# Patient Record
Sex: Female | Born: 1960 | Race: White | Hispanic: No | Smoking: Never smoker
Health system: Southern US, Community
[De-identification: ages and names within clinical notes are randomized; demographics above are authoritative.]

---

## 1998-12-24 ENCOUNTER — Other Ambulatory Visit: Admission: RE | Admit: 1998-12-24 | Discharge: 1998-12-24 | Payer: Self-pay | Admitting: *Deleted

## 2000-01-04 ENCOUNTER — Other Ambulatory Visit: Admission: RE | Admit: 2000-01-04 | Discharge: 2000-01-04 | Payer: Self-pay | Admitting: *Deleted

## 2002-01-12 ENCOUNTER — Other Ambulatory Visit: Admission: RE | Admit: 2002-01-12 | Discharge: 2002-01-12 | Payer: Self-pay | Admitting: *Deleted

## 2005-07-01 ENCOUNTER — Ambulatory Visit: Payer: Self-pay | Admitting: Obstetrics and Gynecology

## 2007-06-15 ENCOUNTER — Ambulatory Visit: Payer: Self-pay | Admitting: Obstetrics and Gynecology

## 2008-07-31 ENCOUNTER — Ambulatory Visit: Payer: Self-pay | Admitting: Obstetrics and Gynecology

## 2009-08-27 ENCOUNTER — Ambulatory Visit: Payer: Self-pay | Admitting: Obstetrics and Gynecology

## 2010-09-08 ENCOUNTER — Ambulatory Visit: Payer: Self-pay | Admitting: Obstetrics and Gynecology

## 2011-09-14 ENCOUNTER — Ambulatory Visit: Payer: Self-pay | Admitting: Obstetrics and Gynecology

## 2012-06-05 ENCOUNTER — Ambulatory Visit: Payer: Self-pay | Admitting: Orthopedic Surgery

## 2012-06-05 LAB — HCG, QUANTITATIVE, PREGNANCY: Beta Hcg, Quant.: <1 m[IU]/mL — ABNORMAL LOW

## 2013-03-09 ENCOUNTER — Ambulatory Visit: Payer: Self-pay | Admitting: Gastroenterology

## 2013-03-09 LAB — HM COLONOSCOPY

## 2013-12-19 LAB — CBC AND DIFFERENTIAL
HCT: 40 % (ref 36–46)
Hemoglobin: 13.3 g/dL (ref 12.0–16.0)
Platelets: 333 10*3/uL (ref 150–399)
WBC: 5.5 10^3/mL

## 2013-12-19 LAB — HEPATIC FUNCTION PANEL
ALT: 33 U/L (ref 7–35)
AST: 26 U/L (ref 13–35)
Alkaline Phosphatase: 93 U/L (ref 25–125)

## 2013-12-19 LAB — BASIC METABOLIC PANEL
BUN: 21 mg/dL (ref 4–21)
CREATININE: 0.8 mg/dL (ref 0.5–1.1)
GLUCOSE: 94 mg/dL
Potassium: 4.4 mmol/L (ref 3.4–5.3)
SODIUM: 142 mmol/L (ref 137–147)

## 2013-12-19 LAB — LIPID PANEL
CHOLESTEROL: 254 mg/dL — AB (ref 0–200)
HDL: 68 mg/dL (ref 35–70)
LDL Cholesterol: 168 mg/dL
TRIGLYCERIDES: 88 mg/dL (ref 40–160)

## 2013-12-19 LAB — TSH: TSH: 1.51 u[IU]/mL (ref 0.41–5.90)

## 2014-07-20 ENCOUNTER — Other Ambulatory Visit: Payer: Self-pay | Admitting: Emergency Medicine

## 2014-07-20 ENCOUNTER — Encounter: Payer: Self-pay | Admitting: Emergency Medicine

## 2014-07-20 DIAGNOSIS — G43909 Migraine, unspecified, not intractable, without status migrainosus: Secondary | ICD-10-CM | POA: Insufficient documentation

## 2014-07-20 DIAGNOSIS — J309 Allergic rhinitis, unspecified: Secondary | ICD-10-CM | POA: Insufficient documentation

## 2014-07-20 DIAGNOSIS — F329 Major depressive disorder, single episode, unspecified: Secondary | ICD-10-CM | POA: Insufficient documentation

## 2014-07-20 DIAGNOSIS — G4700 Insomnia, unspecified: Secondary | ICD-10-CM | POA: Insufficient documentation

## 2014-07-20 DIAGNOSIS — F411 Generalized anxiety disorder: Secondary | ICD-10-CM | POA: Insufficient documentation

## 2014-07-20 DIAGNOSIS — N951 Menopausal and female climacteric states: Secondary | ICD-10-CM | POA: Insufficient documentation

## 2014-09-26 ENCOUNTER — Ambulatory Visit: Payer: Self-pay | Admitting: Family Medicine

## 2014-12-12 ENCOUNTER — Other Ambulatory Visit: Payer: Self-pay | Admitting: Family Medicine

## 2014-12-13 ENCOUNTER — Other Ambulatory Visit: Payer: Self-pay

## 2014-12-17 ENCOUNTER — Telehealth: Payer: Self-pay

## 2014-12-17 NOTE — Telephone Encounter (Signed)
[facility] has sent request for her Sumatriptan pills and injectable.  Last visit was 03/28/2014

## 2014-12-18 ENCOUNTER — Other Ambulatory Visit: Payer: Self-pay

## 2014-12-18 MED ORDER — SUMATRIPTAN SUCCINATE 100 MG PO TABS: 100.0000 mg | ORAL_TABLET | Freq: Once | ORAL | Status: DC

## 2014-12-18 MED ORDER — SUMATRIPTAN SUCCINATE REFILL 6 MG/0.5ML ~~LOC~~ SOCT: 6.0000 mg | Freq: Once | SUBCUTANEOUS | Status: DC

## 2014-12-18 NOTE — Telephone Encounter (Signed)
Okay to refill 1 year 

## 2015-02-06 ENCOUNTER — Encounter: Payer: Self-pay | Admitting: Physician Assistant

## 2015-02-06 ENCOUNTER — Ambulatory Visit (INDEPENDENT_AMBULATORY_CARE_PROVIDER_SITE_OTHER): Payer: Managed Care, Other (non HMO) | Admitting: Physician Assistant

## 2015-02-06 VITALS — BP 102/70 | HR 90 | Temp 98.7°F | Resp 18 | Wt 153.0 lb

## 2015-02-06 DIAGNOSIS — J0100 Acute maxillary sinusitis, unspecified: Secondary | ICD-10-CM

## 2015-02-06 DIAGNOSIS — R87619 Unspecified abnormal cytological findings in specimens from cervix uteri: Secondary | ICD-10-CM | POA: Insufficient documentation

## 2015-02-06 DIAGNOSIS — J40 Bronchitis, not specified as acute or chronic: Secondary | ICD-10-CM | POA: Diagnosis not present

## 2015-02-06 DIAGNOSIS — R05 Cough: Secondary | ICD-10-CM | POA: Diagnosis not present

## 2015-02-06 DIAGNOSIS — R059 Cough, unspecified: Secondary | ICD-10-CM

## 2015-02-06 MED ORDER — HYDROCODONE-HOMATROPINE 5-1.5 MG/5ML PO SYRP: 5.0000 mL | ORAL_SOLUTION | Freq: Four times a day (QID) | ORAL | Status: DC | PRN

## 2015-02-06 MED ORDER — BENZONATATE 200 MG PO CAPS: 200.0000 mg | ORAL_CAPSULE | Freq: Three times a day (TID) | ORAL | Status: DC | PRN

## 2015-02-06 MED ORDER — AMOXICILLIN-POT CLAVULANATE 875-125 MG PO TABS: 1.0000 | ORAL_TABLET | Freq: Two times a day (BID) | ORAL | Status: DC

## 2015-02-06 MED ORDER — PREDNISONE 10 MG (21) PO TBPK: ORAL_TABLET | ORAL | Status: DC

## 2015-02-06 NOTE — Progress Notes (Signed)
Patient ID: Tammy Edwards, female   DOB: 04/23/1960, 54 y.o.   MRN: [id number]       Patient: Tammy Edwards Female    DOB: 03-21-1960   54 y.o.   MRN: [id number] Visit Date: 02/06/2015  Today's Provider: Mar Daring, PA-C   Chief Complaint  Patient presents with  . Nasal Congestion  . Cough   Subjective:    HPI  Patient has had a cold for about 2 weeks and she was trying to treat it with Sudafed, Delsym and Mucinex but not getting better. She is going [place] the mountains this weekend and would like to feel better by then. Symptoms include: cough productive and keeps her up at night, runny nose, post nasal drainage, sinus pressure, headache, scratchy throat, shortness of breath and wheezing. No fever that she knows of.     Allergies  Allergen Reactions  . Codeine    Previous Medications   CHOLECALCIFEROL (VITAMIN D) 1000 UNITS TABLET    Take by mouth.   MULTIPLE VITAMIN PO    Take by mouth.   SUMATRIPTAN (IMITREX) 100 MG TABLET    Take 1 tablet (100 mg total) by mouth once.   SUMATRIPTAN SUCCINATE REFILL 6 MG/0.5ML SOCT    Inject 6 mg into the skin once.    Review of Systems  Constitutional: Positive for fatigue. Negative for fever and chills.  HENT: Positive for congestion, postnasal drip, rhinorrhea, sinus pressure, sore throat and voice change. Negative for ear pain.   Eyes: Negative.   Respiratory: Positive for cough, chest tightness, shortness of breath and wheezing.   Cardiovascular: Negative.  Negative for chest pain.  Gastrointestinal: Negative for nausea, vomiting and abdominal pain.  Neurological: Positive for headaches. Negative for dizziness.    Social History  Substance Use Topics  . Smoking status: Never Smoker   . Smokeless tobacco: Never Used  . Alcohol Use: No   Objective:   BP 102/70 mmHg  Pulse 90  Temp(Src) 98.7 F (37.1 C)  Resp 18  Wt 153 lb (69.4 kg)  SpO2 99%  Physical Exam  Constitutional: She appears well-developed and  well-nourished. No distress.  HENT:  Head: Normocephalic and atraumatic.  Right Ear: Hearing, external ear and ear canal normal. Tympanic membrane is not erythematous and not bulging. A middle ear effusion is present.  Left Ear: Hearing, external ear and ear canal normal. Tympanic membrane is not erythematous and not bulging. A middle ear effusion is present.  Nose: Mucosal edema and rhinorrhea present. Right sinus exhibits maxillary sinus tenderness and frontal sinus tenderness. Left sinus exhibits maxillary sinus tenderness and frontal sinus tenderness.  Mouth/Throat: Uvula is midline, oropharynx is clear and moist and mucous membranes are normal. No oropharyngeal exudate, posterior oropharyngeal edema or posterior oropharyngeal erythema.  Neck: Normal range of motion. Neck supple. No tracheal deviation present. No thyromegaly present.  Cardiovascular: Normal rate, regular rhythm and normal heart sounds.  Exam reveals no gallop and no friction rub.   No murmur heard. Pulmonary/Chest: Effort normal. No stridor. No respiratory distress. She has no decreased breath sounds. She has wheezes (exp throughout). She has no rhonchi. She has no rales.  Lymphadenopathy:    She has no cervical adenopathy.  Skin: She is not diaphoretic.  Vitals reviewed.       Assessment & Plan:     1. Acute maxillary sinusitis, recurrence not specified Worsening symptoms. I will treat with Augmentin as below. I did advise that she may continue to use Mucinex DM  to help with congestion. She also needs to make sure to get plenty of rest and to stay well-hydrated. She is to call the office if symptoms failed to improve or worsen. - amoxicillin-clavulanate (AUGMENTIN) 875-125 MG tablet; Take 1 tablet by mouth 2 (two) times daily.  Dispense: 20 tablet; Refill: 0  2. Bronchitis She did have expiratory wheezes on exam today. I will add a prednisone taper as below to the Augmentin that was prescribed as above. She is to call  the office if symptoms fail to improve or worsen. - predniSONE (STERAPRED UNI-PAK 21 TAB) 10 MG (21) TBPK tablet; Take as directed on package directions  Dispense: 21 tablet; Refill: 0  3. Cough Worsening. I will prescribe Hycodan cough syrup for her to use with nighttime cough. I will also give Tessalon Perles as below for her to use for daytime cough as it does not cause drowsiness. To call the office if symptoms fail to improve or worsen. - HYDROcodone-homatropine (HYCODAN) 5-1.5 MG/5ML syrup; Take 5 mLs by mouth every 6 (six) hours as needed for cough.  Dispense: 180 mL; Refill: 0 - benzonatate (TESSALON) 200 MG capsule; Take 1 capsule (200 mg total) by mouth 3 (three) times daily as needed for cough.  Dispense: 30 capsule; Refill: 0       Mar Daring, PA-C  [facility]

## 2015-03-10 ENCOUNTER — Encounter: Payer: Self-pay | Admitting: Family Medicine

## 2015-03-10 ENCOUNTER — Ambulatory Visit (INDEPENDENT_AMBULATORY_CARE_PROVIDER_SITE_OTHER): Payer: Managed Care, Other (non HMO) | Admitting: Family Medicine

## 2015-03-10 VITALS — BP 102/64 | HR 80 | Temp 98.0°F | Resp 16 | Wt 150.0 lb

## 2015-03-10 DIAGNOSIS — Z13220 Encounter for screening for lipoid disorders: Secondary | ICD-10-CM

## 2015-03-10 DIAGNOSIS — F411 Generalized anxiety disorder: Secondary | ICD-10-CM

## 2015-03-10 DIAGNOSIS — G43809 Other migraine, not intractable, without status migrainosus: Secondary | ICD-10-CM | POA: Diagnosis not present

## 2015-03-10 DIAGNOSIS — G4700 Insomnia, unspecified: Secondary | ICD-10-CM | POA: Diagnosis not present

## 2015-03-10 DIAGNOSIS — F339 Major depressive disorder, recurrent, unspecified: Secondary | ICD-10-CM | POA: Diagnosis not present

## 2015-03-10 DIAGNOSIS — E559 Vitamin D deficiency, unspecified: Secondary | ICD-10-CM | POA: Diagnosis not present

## 2015-03-10 MED ORDER — NAPROXEN 500 MG PO TABS: 500.0000 mg | ORAL_TABLET | Freq: Two times a day (BID) | ORAL | Status: DC

## 2015-03-10 MED ORDER — SUMATRIPTAN SUCCINATE REFILL 6 MG/0.5ML ~~LOC~~ SOCT: 6.0000 mg | Freq: Once | SUBCUTANEOUS | Status: DC

## 2015-03-10 MED ORDER — SUMATRIPTAN SUCCINATE 100 MG PO TABS: 100.0000 mg | ORAL_TABLET | Freq: Once | ORAL | Status: DC

## 2015-03-10 MED ORDER — AZITHROMYCIN 250 MG PO TABS: ORAL_TABLET | ORAL | Status: DC

## 2015-03-10 MED ORDER — PROMETHAZINE HCL 25 MG PO TABS: 25.0000 mg | ORAL_TABLET | Freq: Three times a day (TID) | ORAL | Status: DC | PRN

## 2015-03-10 NOTE — Progress Notes (Signed)
Patient ID: Tammy Edwards, female   DOB: 07-Jun-1960, 55 y.o.   MRN: [id number]    Subjective:  HPI Pt reports that she is here for a follow up of migraines and to get refills on the medications that she takes for them. She reports that she only has 3-4 headaches a month.  Pt also wanted to know if she can have something for nausea, Naproxen and a Zpak because she is about to go on a cruise for a week, she has never been on a cruise and is afraid that she will get sick on the boat. It is of note the cruise is a gift from her ex husband and she is excited about this. Overall she feels well.  Prior to Admission medications   Medication Sig Start Date End Date Taking? Authorizing Provider  cholecalciferol (VITAMIN D) 1000 UNITS tablet Take by mouth. Reported on 03/10/2015   Yes Historical Provider, MD  MULTIPLE VITAMIN PO Take by mouth.   Yes Historical Provider, MD  naproxen (NAPROSYN) 500 MG tablet  02/06/15  Yes Historical Provider, MD  SUMAtriptan (IMITREX) 100 MG tablet Take 1 tablet (100 mg total) by mouth once. 12/18/14  Yes Rodina Pinales Maceo Pro., MD  SUMAtriptan Succinate Refill 6 MG/0.5ML SOCT Inject 6 mg into the skin once. 12/18/14  Yes Khasir Woodrome Maceo Pro., MD    Patient Active Problem List   Diagnosis Date Noted  . Abnormal cervical Papanicolaou smear 02/06/2015  . Allergic rhinitis 07/20/2014  . Anxiety, generalized 07/20/2014  . Cannot sleep 07/20/2014  . Affective disorder, major ([place]) 07/20/2014  . Headache, migraine 07/20/2014  . Symptomatic menopausal or female climacteric states 07/20/2014    History reviewed. No pertinent past medical history.  Social History   Social History  . Marital Status: Divorced    Spouse Name: N/A  . Number of Children: N/A  . Years of Education: N/A   Occupational History  . Not on file.   Social History Main Topics  . Smoking status: Never Smoker   . Smokeless tobacco: Never Used  . Alcohol Use: No  . Drug Use: No  . Sexual  Activity: Not on file   Other Topics Concern  . Not on file   Social History Narrative    Allergies  Allergen Reactions  . Codeine     Review of Systems  Constitutional: Negative.   HENT: Negative.   Eyes: Negative.   Respiratory: Negative.   Cardiovascular: Negative.   Gastrointestinal: Negative.   Genitourinary: Negative.   Musculoskeletal: Negative.   Skin: Negative.   Neurological: Negative.   Endo/Heme/Allergies: Negative.   Psychiatric/Behavioral: Negative.     Immunization History  Administered Date(s) Administered  . Tdap 01/23/2014   Objective:  BP 102/64 mmHg  Pulse 80  Temp(Src) 98 F (36.7 C) (Oral)  Resp 16  Wt 150 lb (68.04 kg)  Physical Exam  Constitutional: She is oriented to person, place, and time and well-developed, well-nourished, and in no distress.  HENT:  Head: Normocephalic and atraumatic.  Right Ear: External ear normal.  Left Ear: External ear normal.  Nose: Nose normal.  Eyes: Conjunctivae are normal.  Neck: Neck supple.  Cardiovascular: Normal rate, regular rhythm, normal heart sounds and intact distal pulses.   Pulmonary/Chest: Effort normal and breath sounds normal.  Abdominal: Soft.  Neurological: She is alert and oriented to person, place, and time.  Skin: Skin is warm and dry.  Psychiatric: Mood, memory, affect and judgment normal.    Lab Results  Component Value Date   WBC 5.5 12/19/2013   HGB 13.3 12/19/2013   HCT 40 12/19/2013   PLT 333 12/19/2013   CHOL 254* 12/19/2013   TRIG 88 12/19/2013   HDL 68 12/19/2013   LDLCALC 168 12/19/2013   TSH 1.51 12/19/2013    CMP     Component Value Date/Time   NA 142 12/19/2013   K 4.4 12/19/2013   BUN 21 12/19/2013   CREATININE 0.8 12/19/2013   AST 26 12/19/2013   ALT 33 12/19/2013   ALKPHOS 93 12/19/2013    Assessment and Plan :  1. Other type of migraine  - promethazine (PHENERGAN) 25 MG tablet; Take 1 tablet (25 mg total) by mouth every 8 (eight) hours as  needed for nausea or vomiting.  Dispense: 30 tablet; Refill: 0 - naproxen (NAPROSYN) 500 MG tablet; Take 1 tablet (500 mg total) by mouth 2 (two) times daily with a meal.  Dispense: 60 tablet; Refill: 12 - SUMAtriptan Succinate Refill 6 MG/0.5ML SOCT; Inject 6 mg into the skin once.  Dispense: 3 cartridge; Refill: 12 - SUMAtriptan (IMITREX) 100 MG tablet; Take 1 tablet (100 mg total) by mouth once.  Dispense: 10 tablet; Refill: 12 - CBC with Differential/Platelet  2. Anxiety, generalized  - TSH  3. Cannot sleep/Chronic Insomnia   4. Recurrent major depressive disorder, remission status unspecified ([place]) In remission.More than 50% of time spent in counselling. - TSH  5. Lipid screening  - Comprehensive metabolic panel - Lipid Panel With LDL/HDL Ratio  6. Vitamin D deficiency  - VITAMIN D 25 Hydroxy (Vit-D Deficiency, Fractures) . Patient was seen and examined by Dr. Miguel Aschoff, and noted scribed by Webb Laws, [place] MD [facility] 03/10/2015 10:41 AM

## 2015-03-11 ENCOUNTER — Other Ambulatory Visit: Payer: Self-pay | Admitting: Family Medicine

## 2015-03-11 LAB — CBC WITH DIFFERENTIAL/PLATELET
Basophils Absolute: 0 10*3/uL (ref 0.0–0.2)
Basos: 1 %
EOS (ABSOLUTE): 0.1 10*3/uL (ref 0.0–0.4)
Eos: 1 %
Hematocrit: 38.6 % (ref 34.0–46.6)
Hemoglobin: 12.2 g/dL (ref 11.1–15.9)
Immature Grans (Abs): 0 10*3/uL (ref 0.0–0.1)
Immature Granulocytes: 0 %
Lymphocytes Absolute: 2.6 10*3/uL (ref 0.7–3.1)
Lymphs: 43 %
MCH: 25.8 pg — ABNORMAL LOW (ref 26.6–33.0)
MCHC: 31.6 g/dL (ref 31.5–35.7)
MCV: 82 fL (ref 79–97)
Monocytes Absolute: 0.4 10*3/uL (ref 0.1–0.9)
Monocytes: 7 %
Neutrophils Absolute: 3 10*3/uL (ref 1.4–7.0)
Neutrophils: 48 %
Platelets: 420 10*3/uL — ABNORMAL HIGH (ref 150–379)
RBC: 4.72 x10E6/uL (ref 3.77–5.28)
RDW: 14.9 % (ref 12.3–15.4)
WBC: 6.1 10*3/uL (ref 3.4–10.8)

## 2015-03-11 LAB — COMPREHENSIVE METABOLIC PANEL
A/G RATIO: 1.8 (ref 1.1–2.5)
ALK PHOS: 127 IU/L — AB (ref 39–117)
ALT: 12 IU/L (ref 0–32)
AST: 17 IU/L (ref 0–40)
Albumin: 4.5 g/dL (ref 3.5–5.5)
BUN/Creatinine Ratio: 27 — ABNORMAL HIGH (ref 9–23)
BUN: 20 mg/dL (ref 6–24)
Bilirubin Total: 0.3 mg/dL (ref 0.0–1.2)
CALCIUM: 9.6 mg/dL (ref 8.7–10.2)
CO2: 22 mmol/L (ref 18–29)
CREATININE: 0.75 mg/dL (ref 0.57–1.00)
Chloride: 99 mmol/L (ref 96–106)
GFR calc Af Amer: 106 mL/min/{1.73_m2} (ref >59)
GFR, EST NON AFRICAN AMERICAN: 92 mL/min/{1.73_m2} (ref >59)
GLOBULIN, TOTAL: 2.5 g/dL (ref 1.5–4.5)
Glucose: 90 mg/dL (ref 65–99)
POTASSIUM: 4.8 mmol/L (ref 3.5–5.2)
SODIUM: 139 mmol/L (ref 134–144)
Total Protein: 7 g/dL (ref 6.0–8.5)

## 2015-03-11 LAB — LIPID PANEL WITH LDL/HDL RATIO
Cholesterol, Total: 265 mg/dL — ABNORMAL HIGH (ref 100–199)
HDL: 62 mg/dL (ref >39)
LDL Calculated: 179 mg/dL — ABNORMAL HIGH (ref 0–99)
LDl/HDL Ratio: 2.9 ratio units (ref 0.0–3.2)
Triglycerides: 122 mg/dL (ref 0–149)
VLDL Cholesterol Cal: 24 mg/dL (ref 5–40)

## 2015-03-11 LAB — TSH: TSH: 1.34 u[IU]/mL (ref 0.450–4.500)

## 2015-03-11 LAB — VITAMIN D 25 HYDROXY (VIT D DEFICIENCY, FRACTURES): Vit D, 25-Hydroxy: 59.7 ng/mL (ref 30.0–100.0)

## 2015-03-13 ENCOUNTER — Telehealth: Payer: Self-pay | Admitting: Family Medicine

## 2015-03-13 DIAGNOSIS — G43809 Other migraine, not intractable, without status migrainosus: Secondary | ICD-10-CM

## 2015-03-13 MED ORDER — SUMATRIPTAN SUCCINATE 100 MG PO TABS: 100.0000 mg | ORAL_TABLET | Freq: Once | ORAL | Status: DC

## 2015-03-13 NOTE — Telephone Encounter (Signed)
Pt informed and voiced understanding of results. Med sent in.  

## 2015-03-13 NOTE — Telephone Encounter (Signed)
Pt called back saying the SUMAtriptan (IMITREX) 100 MG tablet should have been called in to mail order pharmacy. [facility]  Can you please send the Imitrex to the mail order [facility] (she thinks)  Thanks Con Memos

## 2015-03-27 ENCOUNTER — Other Ambulatory Visit: Payer: Self-pay | Admitting: Family Medicine

## 2015-04-29 ENCOUNTER — Other Ambulatory Visit: Payer: Self-pay | Admitting: Family Medicine

## 2015-05-05 ENCOUNTER — Telehealth: Payer: Self-pay

## 2015-05-05 NOTE — Telephone Encounter (Signed)
lmtcb-aa 

## 2015-05-05 NOTE — Telephone Encounter (Signed)
Spoke with patient and she will have mammogram done sometimes this year she needs to call and make appointment and she will let us know. Pap smear updated in the chart

## 2015-05-05 NOTE — Telephone Encounter (Signed)
lmtcb-need to update when her last mammogram was done and pap-aa

## 2015-05-05 NOTE — Telephone Encounter (Signed)
Pt returned your call. ° °ThanksTeri °

## 2015-10-07 ENCOUNTER — Ambulatory Visit
Admission: RE | Admit: 2015-10-07 | Discharge: 2015-10-07 | Disposition: A | Discharge status: Home / Self Care | Payer: Managed Care, Other (non HMO) | Source: Ambulatory Visit | Attending: Obstetrics and Gynecology | Admitting: Obstetrics and Gynecology

## 2015-10-07 ENCOUNTER — Other Ambulatory Visit: Payer: Self-pay | Admitting: Obstetrics and Gynecology

## 2015-10-07 DIAGNOSIS — Z1231 Encounter for screening mammogram for malignant neoplasm of breast: Secondary | ICD-10-CM | POA: Diagnosis present

## 2015-10-07 LAB — HM PAP SMEAR: HM PAP: NEGATIVE

## 2015-10-15 ENCOUNTER — Ambulatory Visit (INDEPENDENT_AMBULATORY_CARE_PROVIDER_SITE_OTHER): Payer: Managed Care, Other (non HMO) | Admitting: Family Medicine

## 2015-10-15 ENCOUNTER — Encounter: Payer: Self-pay | Admitting: Family Medicine

## 2015-10-15 VITALS — BP 108/62 | HR 74 | Temp 98.6°F | Resp 16 | Ht 63.75 in | Wt 150.0 lb

## 2015-10-15 DIAGNOSIS — G43809 Other migraine, not intractable, without status migrainosus: Secondary | ICD-10-CM | POA: Diagnosis not present

## 2015-10-15 DIAGNOSIS — F411 Generalized anxiety disorder: Secondary | ICD-10-CM | POA: Diagnosis not present

## 2015-10-15 DIAGNOSIS — T753XXS Motion sickness, sequela: Secondary | ICD-10-CM

## 2015-10-15 DIAGNOSIS — G4700 Insomnia, unspecified: Secondary | ICD-10-CM

## 2015-10-15 MED ORDER — ZOLPIDEM TARTRATE 10 MG PO TABS: 10.0000 mg | ORAL_TABLET | Freq: Every evening | ORAL | 5 refills | Status: DC | PRN

## 2015-10-15 MED ORDER — SCOPOLAMINE 1 MG/3DAYS TD PT72: 1.0000 | MEDICATED_PATCH | TRANSDERMAL | 0 refills | Status: DC

## 2015-10-15 NOTE — Progress Notes (Signed)
Patient: Tammy Edwards Female    DOB: 19-Oct-1960   55 y.o.   MRN: [id number] Visit Date: 10/15/2015  Today's Provider: Wilhemena Durie, MD   Chief Complaint  Patient presents with  . Migraine    follow up    Subjective:    HPI Patient comes in today to follow up on her migraine headaches. Patient reports that her migraines have pretty much been controlled. However, she does report that she has a migraine today. Patient is requesting a refill on her migraine medication (Imitrex 100mg ).   Patient also reports that she is going on a cruise next week and she would like the patches to help with sea sickness. She is also requesting something to help her with sleep.     Allergies  Allergen Reactions  . Codeine      Current Outpatient Prescriptions:  Marland Kitchen  MULTIPLE VITAMIN PO, Take by mouth., Disp: , Rfl:  .  SUMAtriptan (IMITREX) 100 MG tablet, Take 1 tablet (100 mg total) by mouth once., Disp: 10 tablet, Rfl: 12 .  SUMAtriptan Succinate Refill 6 MG/0.5ML SOCT, Inject 6 mg into the skin once., Disp: 3 cartridge, Rfl: 12 .  SUMAtriptan Succinate Refill 6 MG/0.5ML SOCT, Inject 6 mg into the skin  once., Disp: 12 mL, Rfl: 12 .  cholecalciferol (VITAMIN D) 1000 UNITS tablet, Take by mouth. Reported on 03/10/2015, Disp: , Rfl:  .  naproxen (NAPROSYN) 500 MG tablet, Take 1 tablet by mouth  twice a day as needed for  pain, Disp: 180 tablet, Rfl: 3 .  promethazine (PHENERGAN) 25 MG tablet, Take 1 tablet (25 mg total) by mouth every 8 (eight) hours as needed for nausea or vomiting., Disp: 30 tablet, Rfl: 0 .  topiramate (TOPAMAX) 100 MG tablet, Take 1 tablet by mouth  daily (Patient not taking: Reported on 10/15/2015), Disp: 90 tablet, Rfl: 3  Review of Systems  Constitutional: Negative.   Eyes: Negative.   Respiratory: Negative.   Cardiovascular: Negative.   Endocrine: Negative.   Allergic/Immunologic: Negative.   Neurological: Negative.   Psychiatric/Behavioral: Negative.      Social History  Substance Use Topics  . Smoking status: Never Smoker  . Smokeless tobacco: Never Used  . Alcohol use No   Objective:   BP 108/62 (BP Location: Right Arm, Patient Position: Sitting, Cuff Size: Normal)   Pulse 74   Temp 98.6 F (37 C)   Resp 16   Ht 5' 3.75" (1.619 m)   Wt 150 lb (68 kg)   BMI 25.95 kg/m   Physical Exam  Constitutional: She is oriented to person, place, and time. She appears well-developed and well-nourished.  HENT:  Head: Normocephalic and atraumatic.  Eyes: Conjunctivae are normal. No scleral icterus.  Neck: No thyromegaly present.  Cardiovascular: Normal rate, regular rhythm, normal heart sounds and intact distal pulses.   Pulmonary/Chest: Effort normal and breath sounds normal.  Abdominal: Soft.  Lymphadenopathy:    She has no cervical adenopathy.  Neurological: She is alert and oriented to person, place, and time. No cranial nerve deficit. She exhibits normal muscle tone. Coordination normal.  Skin: Skin is warm and dry.  Psychiatric: She has a normal mood and affect. Her behavior is normal. Judgment and thought content normal.        Assessment & Plan:     1. Other type of migraine   2. Cannot sleep Stable, encouraged to cut back on sleeping pills  3. Anxiety, generalized Stable.  4. Sea sickness, sequela  - scopolamine (TRANSDERM-SCOP, 1.5 MG,) 1 MG/3DAYS; Place 1 patch (1.5 mg total) onto the skin every 3 (three) days.  Dispense: 4 patch; Refill: 0     I have done the exam and reviewed the above chart and it is accurate to the best of my knowledge.   Richard Cranford Mon, MD  [facility]

## 2015-10-28 ENCOUNTER — Other Ambulatory Visit: Payer: Self-pay

## 2015-11-18 ENCOUNTER — Ambulatory Visit (INDEPENDENT_AMBULATORY_CARE_PROVIDER_SITE_OTHER): Payer: Managed Care, Other (non HMO) | Admitting: Family Medicine

## 2015-11-18 VITALS — BP 98/62 | HR 78 | Temp 98.4°F | Resp 16 | Wt 147.0 lb

## 2015-11-18 DIAGNOSIS — J301 Allergic rhinitis due to pollen: Secondary | ICD-10-CM

## 2015-11-18 DIAGNOSIS — J029 Acute pharyngitis, unspecified: Secondary | ICD-10-CM

## 2015-11-18 NOTE — Progress Notes (Signed)
Tammy Edwards  MRN: [id number] DOB: 04-20-1960  Subjective:  HPI   The patient is a 55 year old female with cold symptoms for the last 5 days.  She started with a scratchy throat that has now turned into a productive cough and congested nose of yellow sputum.  She has headache and ear pain.  She complains of her nose being very stuffy. She has tried Mucinex D and had to take Afrin for her stopped up nose.    Patient Active Problem List   Diagnosis Date Noted  . Abnormal cervical Papanicolaou smear 02/06/2015  . Allergic rhinitis 07/20/2014  . Anxiety, generalized 07/20/2014  . Cannot sleep 07/20/2014  . Affective disorder, major 07/20/2014  . Headache, migraine 07/20/2014  . Symptomatic menopausal or female climacteric states 07/20/2014    No past medical history on file.  Social History   Social History  . Marital status: Divorced    Spouse name: N/A  . Number of children: N/A  . Years of education: N/A   Occupational History  . Not on file.   Social History Main Topics  . Smoking status: Never Smoker  . Smokeless tobacco: Never Used  . Alcohol use No  . Drug use: No  . Sexual activity: Not on file   Other Topics Concern  . Not on file   Social History Narrative  . No narrative on file    Outpatient Encounter Prescriptions as of 11/18/2015  Medication Sig Note  . cholecalciferol (VITAMIN D) 1000 UNITS tablet Take by mouth. Reported on 03/10/2015 07/20/2014: Received from: [facility]  . Melatonin 5 MG TABS Take by mouth.   . MULTIPLE VITAMIN PO Take by mouth. 07/20/2014: Received from: [facility]  . naproxen (NAPROSYN) 500 MG tablet Take 1 tablet by mouth  twice a day as needed for  pain   . SUMAtriptan (IMITREX) 100 MG tablet Take 1 tablet (100 mg total) by mouth once.   . SUMAtriptan Succinate Refill 6 MG/0.5ML SOCT Inject 6 mg into the skin once.   . SUMAtriptan Succinate Refill 6 MG/0.5ML SOCT Inject 6 mg into the skin   once.   . [DISCONTINUED] promethazine (PHENERGAN) 25 MG tablet Take 1 tablet (25 mg total) by mouth every 8 (eight) hours as needed for nausea or vomiting.   . [DISCONTINUED] scopolamine (TRANSDERM-SCOP, 1.5 MG,) 1 MG/3DAYS Place 1 patch (1.5 mg total) onto the skin every 3 (three) days.   . [DISCONTINUED] topiramate (TOPAMAX) 100 MG tablet Take 1 tablet by mouth  daily (Patient not taking: Reported on 10/15/2015)   . [DISCONTINUED] zolpidem (AMBIEN) 10 MG tablet Take 1 tablet (10 mg total) by mouth at bedtime as needed for sleep.    No facility-administered encounter medications on file as of 11/18/2015.     Allergies  Allergen Reactions  . Codeine     Review of Systems  Constitutional: Positive for chills, diaphoresis and malaise/fatigue. Negative for fever.  HENT: Positive for congestion, ear pain and sore throat. Negative for ear discharge, hearing loss, nosebleeds and tinnitus.   Eyes: Positive for pain. Negative for blurred vision, double vision, photophobia, discharge and redness.  Respiratory: Positive for cough and sputum production. Negative for shortness of breath and wheezing.   Cardiovascular: Negative for chest pain, palpitations, orthopnea and leg swelling.  Gastrointestinal: Negative.   Neurological: Positive for weakness and headaches.  Endo/Heme/Allergies: Negative.   Psychiatric/Behavioral: Negative.    Objective:  BP 98/62   Pulse 78   Temp 98.4  F (36.9 C) (Oral)   Resp 16   Wt 147 lb (66.7 kg)   BMI 25.43 kg/m   Physical Exam  HENT:  Head: Normocephalic and atraumatic.  Right Ear: External ear normal.  Left Ear: External ear normal.  Nose: Nose normal.  Mouth/Throat: Oropharynx is clear and moist.  Eyes: Conjunctivae are normal.  Neck: Neck supple. No thyromegaly present.  Cardiovascular: Normal rate, regular rhythm and normal heart sounds.   Pulmonary/Chest: Effort normal and breath sounds normal.  Abdominal: Soft.  Lymphadenopathy:    She has no  cervical adenopathy.  Neurological: She is alert.  Skin: Skin is warm and dry.  Psychiatric: Mood, memory, affect and judgment normal.    Assessment and Plan :   URI I do not think the patient would benefit at all from antibiotics at this time. Continue Mucinex D and encouraged her to push fluids. We'll keep her out of work for a day or 2.  I have done the exam and reviewed the chart and it is accurate to the best of my knowledge. Miguel Aschoff M.D. [facility]

## 2015-11-24 ENCOUNTER — Telehealth: Payer: Self-pay | Admitting: Family Medicine

## 2015-11-24 NOTE — Telephone Encounter (Signed)
Pt stated she was in the office 11/18/15 and is still coughing and having trouble sleeping b/c of the cough. Pt stated OTC isn't helping and would like something she can take during the day and something else at night that will help her sleep. [facility] [address] [address] and she is fine if she needs to pick up an Rx. Please advise. Thanks TNP

## 2015-11-24 NOTE — Telephone Encounter (Signed)
Please review-aa 

## 2015-11-26 NOTE — Telephone Encounter (Signed)
Pt called back wanting to know if we were going to send something to the pharmacy for her cough.  She cant sleep at night.

## 2015-11-26 NOTE — Telephone Encounter (Signed)
Please review. Emily Drozdowski, CMA  

## 2015-11-27 MED ORDER — HYDROCOD POLST-CPM POLST ER 10-8 MG/5ML PO SUER: 5.0000 mL | Freq: Every evening | ORAL | 0 refills | Status: DC | PRN

## 2015-11-27 NOTE — Telephone Encounter (Signed)
Pt advised, patient states she had some itching with taking Codeine but when she was 20 and took a little since then and wants to try this RX. RX printed-aa

## 2015-11-27 NOTE — Telephone Encounter (Signed)
Tussionex--5cc q hs prn,60 cc

## 2015-12-21 ENCOUNTER — Other Ambulatory Visit: Payer: Self-pay | Admitting: Family Medicine

## 2015-12-29 ENCOUNTER — Encounter: Payer: Self-pay | Admitting: Family Medicine

## 2016-03-11 ENCOUNTER — Ambulatory Visit (INDEPENDENT_AMBULATORY_CARE_PROVIDER_SITE_OTHER): Payer: Managed Care, Other (non HMO) | Admitting: Family Medicine

## 2016-03-11 VITALS — BP 102/64 | HR 88 | Resp 16 | Wt 150.0 lb

## 2016-03-11 DIAGNOSIS — E784 Other hyperlipidemia: Secondary | ICD-10-CM

## 2016-03-11 DIAGNOSIS — E559 Vitamin D deficiency, unspecified: Secondary | ICD-10-CM

## 2016-03-11 DIAGNOSIS — G4709 Other insomnia: Secondary | ICD-10-CM | POA: Diagnosis not present

## 2016-03-11 DIAGNOSIS — G43809 Other migraine, not intractable, without status migrainosus: Secondary | ICD-10-CM

## 2016-03-11 DIAGNOSIS — R3129 Other microscopic hematuria: Secondary | ICD-10-CM

## 2016-03-11 DIAGNOSIS — K769 Liver disease, unspecified: Secondary | ICD-10-CM

## 2016-03-11 DIAGNOSIS — F411 Generalized anxiety disorder: Secondary | ICD-10-CM

## 2016-03-11 DIAGNOSIS — E7849 Other hyperlipidemia: Secondary | ICD-10-CM

## 2016-03-11 MED ORDER — SUMATRIPTAN SUCCINATE REFILL 6 MG/0.5ML ~~LOC~~ SOCT: SUBCUTANEOUS | 3 refills | Status: DC

## 2016-03-11 MED ORDER — SUMATRIPTAN SUCCINATE 100 MG PO TABS: 100.0000 mg | ORAL_TABLET | Freq: Every day | ORAL | 3 refills | Status: DC | PRN

## 2016-03-11 NOTE — Progress Notes (Signed)
Lorianne Terhune  MRN: [id number] DOB: 11/19/1960  Subjective:  HPI  Patient is here for follow up. Last office visit was on 10/15/15. Last labs were done on 03/10/15 and included Vitamin D level. At that time advised patient to switch Vitamin D RX to OTC vitamin D. For migraines she uses Imitrex tablets and injections. Now has to use it less then once a week and use to be 4 times a week. Also she has seen Dr Ouida Sills in October and has had issues with blood in her urine on microscopic exam and had CT Urogram done and wanted to go over that today.  Patient Active Problem List   Diagnosis Date Noted  . Abnormal cervical Papanicolaou smear 02/06/2015  . Allergic rhinitis 07/20/2014  . Anxiety, generalized 07/20/2014  . Cannot sleep 07/20/2014  . Affective disorder, major 07/20/2014  . Headache, migraine 07/20/2014  . Symptomatic menopausal or female climacteric states 07/20/2014    No past medical history on file.  Social History   Social History  . Marital status: Divorced    Spouse name: N/A  . Number of children: N/A  . Years of education: N/A   Occupational History  . Not on file.   Social History Main Topics  . Smoking status: Never Smoker  . Smokeless tobacco: Never Used  . Alcohol use No  . Drug use: No  . Sexual activity: Not on file   Other Topics Concern  . Not on file   Social History Narrative  . No narrative on file    Outpatient Encounter Prescriptions as of 03/11/2016  Medication Sig Note  . cholecalciferol (VITAMIN D) 1000 UNITS tablet Take by mouth. Reported on 03/10/2015 07/20/2014: Received from: [facility]  . Melatonin 5 MG TABS Take by mouth.   . naproxen (NAPROSYN) 500 MG tablet Take 1 tablet by mouth  twice a day as needed for  pain   . SUMAtriptan (IMITREX) 100 MG tablet Take 1 tablet (100 mg total) by mouth once.   . SUMAtriptan Succinate Refill 6 MG/0.5ML SOCT Inject 6 mg into the skin once.   . SUMAtriptan Succinate  Refill 6 MG/0.5ML SOCT Inject 6 mg into the skin  once.   Marland Kitchen zolpidem (AMBIEN) 10 MG tablet Take 10 mg by mouth at bedtime as needed. for sleep   . [DISCONTINUED] chlorpheniramine-HYDROcodone (TUSSIONEX PENNKINETIC ER) 10-8 MG/5ML SUER Take 5 mLs by mouth at bedtime as needed for cough.   . [DISCONTINUED] MULTIPLE VITAMIN PO Take by mouth. 07/20/2014: Received from: [facility]  . [DISCONTINUED] sertraline (ZOLOFT) 100 MG tablet TAKE 1 TABLET BY MOUTH  EVERY DAY    No facility-administered encounter medications on file as of 03/11/2016.     Allergies  Allergen Reactions  . Codeine     Review of Systems  Constitutional: Negative.   Respiratory: Negative.   Cardiovascular: Negative.   Gastrointestinal: Negative.   Genitourinary: Negative.   Musculoskeletal: Positive for joint pain (shoulder at times).  Endo/Heme/Allergies: Negative.   Psychiatric/Behavioral: Negative.     Objective:  BP 102/64   Pulse 88   Resp 16   Wt 150 lb (68 kg)   BMI 25.95 kg/m   Physical Exam  Constitutional: She is oriented to person, place, and time and well-developed, well-nourished, and in no distress.  HENT:  Head: Normocephalic and atraumatic.  Right Ear: External ear normal.  Left Ear: External ear normal.  Nose: Nose normal.  Eyes: Conjunctivae are normal. Pupils are equal, round, and  reactive to light.  Neck: Normal range of motion. Neck supple.  Cardiovascular: Normal rate, regular rhythm, normal heart sounds and intact distal pulses.   No murmur heard. Pulmonary/Chest: Effort normal and breath sounds normal. No respiratory distress. She has no wheezes.  Abdominal: Soft. She exhibits no distension. There is no tenderness.  Musculoskeletal: She exhibits no edema or tenderness.  Neurological: She is alert and oriented to person, place, and time. Gait normal. GCS score is 15.  Skin: Skin is warm and dry.  Psychiatric: Mood, memory, affect and judgment normal.    Assessment  and Plan :  1. Other insomnia Stable.  2. Anxiety, generalized - TSH  3. Vitamin D deficiency Re check levels since patient is taking Vitamin D OTC - Vitamin D 1,25 dihydroxy  4. Other migraine without status migrainosus, not intractable Stable. Refills given.  5. Hepatic lesion New on recent CT scan. Recommendation was  - MR Abdomen W Wo Contrast; Future  6. Other microscopic hematuria New. Re check UA today. Patient has follow up set up with urologist on this. - CBC w/Diff/Platelet - Urinalysis, Routine w reflex microscopic  7. Other hyperlipidemia - Comprehensive metabolic panel - Lipid Panel With LDL/HDL Ratio  HPI, Exam and A&P transcribed under direction and in the presence of Miguel Aschoff, MD. I have done the exam and reviewed the chart and it is accurate to the best of my knowledge. Development worker, community has been used and  any errors in dictation or transcription are unintentional. Miguel Aschoff M.D. [facility]

## 2016-03-16 ENCOUNTER — Telehealth: Payer: Self-pay

## 2016-03-16 LAB — CBC WITH DIFFERENTIAL/PLATELET
BASOS ABS: 0 10*3/uL (ref 0.0–0.2)
Basos: 0 %
EOS (ABSOLUTE): 0.2 10*3/uL (ref 0.0–0.4)
Eos: 4 %
HEMATOCRIT: 39 % (ref 34.0–46.6)
HEMOGLOBIN: 13.2 g/dL (ref 11.1–15.9)
IMMATURE GRANS (ABS): 0 10*3/uL (ref 0.0–0.1)
IMMATURE GRANULOCYTES: 0 %
LYMPHS ABS: 2.8 10*3/uL (ref 0.7–3.1)
LYMPHS: 48 %
MCH: 30.3 pg (ref 26.6–33.0)
MCHC: 33.8 g/dL (ref 31.5–35.7)
MCV: 89 fL (ref 79–97)
MONOCYTES: 8 %
Monocytes Absolute: 0.4 10*3/uL (ref 0.1–0.9)
NEUTROS PCT: 40 %
Neutrophils Absolute: 2.3 10*3/uL (ref 1.4–7.0)
Platelets: 302 10*3/uL (ref 150–379)
RBC: 4.36 x10E6/uL (ref 3.77–5.28)
RDW: 13.8 % (ref 12.3–15.4)
WBC: 5.8 10*3/uL (ref 3.4–10.8)

## 2016-03-16 LAB — URINALYSIS, ROUTINE W REFLEX MICROSCOPIC
BILIRUBIN UA: NEGATIVE
Glucose, UA: NEGATIVE
KETONES UA: NEGATIVE
Leukocytes, UA: NEGATIVE
NITRITE UA: NEGATIVE
Protein, UA: NEGATIVE
SPEC GRAV UA: 1.024 (ref 1.005–1.030)
Urobilinogen, Ur: 0.2 mg/dL (ref 0.2–1.0)
pH, UA: 5.5 (ref 5.0–7.5)

## 2016-03-16 LAB — COMPREHENSIVE METABOLIC PANEL
ALBUMIN: 4.3 g/dL (ref 3.5–5.5)
ALK PHOS: 125 IU/L — AB (ref 39–117)
ALT: 33 IU/L — ABNORMAL HIGH (ref 0–32)
AST: 30 IU/L (ref 0–40)
Albumin/Globulin Ratio: 1.7 (ref 1.2–2.2)
BUN / CREAT RATIO: 23 (ref 9–23)
BUN: 16 mg/dL (ref 6–24)
Bilirubin Total: 0.2 mg/dL (ref 0.0–1.2)
CALCIUM: 9.4 mg/dL (ref 8.7–10.2)
CO2: 22 mmol/L (ref 18–29)
CREATININE: 0.7 mg/dL (ref 0.57–1.00)
Chloride: 102 mmol/L (ref 96–106)
GFR calc Af Amer: 114 mL/min/{1.73_m2} (ref >59)
GFR, EST NON AFRICAN AMERICAN: 99 mL/min/{1.73_m2} (ref >59)
GLOBULIN, TOTAL: 2.5 g/dL (ref 1.5–4.5)
GLUCOSE: 93 mg/dL (ref 65–99)
Potassium: 4.6 mmol/L (ref 3.5–5.2)
SODIUM: 142 mmol/L (ref 134–144)
TOTAL PROTEIN: 6.8 g/dL (ref 6.0–8.5)

## 2016-03-16 LAB — LIPID PANEL WITH LDL/HDL RATIO
CHOLESTEROL TOTAL: 252 mg/dL — AB (ref 100–199)
HDL: 46 mg/dL (ref >39)
LDL CALC: 171 mg/dL — AB (ref 0–99)
LDl/HDL Ratio: 3.7 ratio units — ABNORMAL HIGH (ref 0.0–3.2)
Triglycerides: 173 mg/dL — ABNORMAL HIGH (ref 0–149)
VLDL Cholesterol Cal: 35 mg/dL (ref 5–40)

## 2016-03-16 LAB — MICROSCOPIC EXAMINATION
Bacteria, UA: NONE SEEN
CASTS: NONE SEEN /LPF

## 2016-03-16 LAB — VITAMIN D 1,25 DIHYDROXY
Vitamin D 1, 25 (OH)2 Total: 29 pg/mL
Vitamin D2 1, 25 (OH)2: <10 pg/mL
Vitamin D3 1, 25 (OH)2: 26 pg/mL

## 2016-03-16 LAB — TSH: TSH: 1.24 u[IU]/mL (ref 0.450–4.500)

## 2016-03-16 NOTE — Telephone Encounter (Signed)
-----   Message from Jerrol Banana., MD sent at 03/16/2016 11:07 AM EST ----- Labs okay. Moderately elevated cholesterol/lipids. Work on diet and exercise.

## 2016-03-16 NOTE — Telephone Encounter (Signed)
lmtcb Hayly Litsey Drozdowski, CMA  

## 2016-03-16 NOTE — Telephone Encounter (Signed)
Advised pt of lab results. Pt verbally acknowledges understanding. Lonie Rummell Drozdowski, CMA   

## 2016-03-24 ENCOUNTER — Ambulatory Visit
Admission: RE | Admit: 2016-03-24 | Discharge: 2016-03-24 | Disposition: A | Discharge status: Home / Self Care | Payer: Managed Care, Other (non HMO) | Source: Ambulatory Visit | Attending: Family Medicine | Admitting: Family Medicine

## 2016-03-24 DIAGNOSIS — K769 Liver disease, unspecified: Secondary | ICD-10-CM | POA: Insufficient documentation

## 2016-03-24 DIAGNOSIS — D1803 Hemangioma of intra-abdominal structures: Secondary | ICD-10-CM | POA: Insufficient documentation

## 2016-03-24 MED ORDER — GADOBENATE DIMEGLUMINE 529 MG/ML IV SOLN
15.0000 mL | Freq: Once | INTRAVENOUS | Status: AC | PRN
  Administered 2016-03-24: 14 mL via INTRAVENOUS

## 2016-03-25 ENCOUNTER — Telehealth: Payer: Self-pay | Admitting: Family Medicine

## 2016-03-25 NOTE — Telephone Encounter (Signed)
I would check with urologist about that.

## 2016-03-25 NOTE — Telephone Encounter (Signed)
Pt was called about MRI results. Informed of results. She is still concerned about why she still has blood in her urine. She not visually seeing any but when UA is done it is found. She has seen urology and had a cystoscopy and they wanted to repeat that in 6 months but she wants to know if there is anything else that needs to be done to find out why she is still having the blood in her urine. Please advise.

## 2016-03-25 NOTE — Telephone Encounter (Signed)
[id number]-[id number] 

## 2016-03-25 NOTE — Telephone Encounter (Signed)
Pt called saying some one called and left her a message yesterday.  I didn't see a message to call back in her chart.  Please call her back .  Thanks Con Memos

## 2016-03-25 NOTE — Telephone Encounter (Signed)
Patient has been advised. KW 

## 2016-03-29 ENCOUNTER — Other Ambulatory Visit: Payer: Self-pay

## 2016-03-29 DIAGNOSIS — G4709 Other insomnia: Secondary | ICD-10-CM

## 2016-03-29 NOTE — Telephone Encounter (Signed)
Please review, refill request from [facility] for [facility]

## 2016-03-30 ENCOUNTER — Other Ambulatory Visit: Payer: Self-pay | Admitting: Family Medicine

## 2016-03-31 MED ORDER — ZOLPIDEM TARTRATE 10 MG PO TABS: 10.0000 mg | ORAL_TABLET | Freq: Every evening | ORAL | 5 refills | Status: DC | PRN

## 2016-04-28 ENCOUNTER — Other Ambulatory Visit: Payer: Self-pay | Admitting: Family Medicine

## 2016-09-08 ENCOUNTER — Ambulatory Visit (INDEPENDENT_AMBULATORY_CARE_PROVIDER_SITE_OTHER): Payer: Managed Care, Other (non HMO) | Admitting: Family Medicine

## 2016-09-08 VITALS — BP 98/64 | HR 84 | Temp 98.0°F | Resp 14 | Wt 151.0 lb

## 2016-09-08 DIAGNOSIS — G4709 Other insomnia: Secondary | ICD-10-CM | POA: Diagnosis not present

## 2016-09-08 DIAGNOSIS — E7849 Other hyperlipidemia: Secondary | ICD-10-CM

## 2016-09-08 DIAGNOSIS — G43809 Other migraine, not intractable, without status migrainosus: Secondary | ICD-10-CM

## 2016-09-08 DIAGNOSIS — E784 Other hyperlipidemia: Secondary | ICD-10-CM

## 2016-09-08 MED ORDER — SUMATRIPTAN SUCCINATE 100 MG PO TABS: 100.0000 mg | ORAL_TABLET | Freq: Every day | ORAL | 3 refills | Status: DC | PRN

## 2016-09-08 MED ORDER — SUMATRIPTAN SUCCINATE REFILL 6 MG/0.5ML ~~LOC~~ SOCT: SUBCUTANEOUS | 3 refills | Status: DC

## 2016-09-08 MED ORDER — ZOLPIDEM TARTRATE 10 MG PO TABS: 10.0000 mg | ORAL_TABLET | Freq: Every evening | ORAL | 5 refills | Status: DC | PRN

## 2016-09-08 NOTE — Progress Notes (Signed)
Tammy Edwards  MRN: [id number] DOB: 1960/04/09  Subjective:  HPI  Patient is here for 6 months follow up. Last office visit was on 03/11/16. Routine lab work including Vitamin D was checked at that time. Cholesterol was mildly elevated and patient was advised to work on diet and exercise. Migraines: patient is doing better with this, gets migraines maybe 2 to 3 a month. She uses Imitrex tablets and injections as needed. Stopped using Topamax due to migraines decreasing. Insomnia: patient takes Melatonin as needed and Ambien about once a week. Depression screen PHQ 2/9 09/08/2016  Decreased Interest 1  Down, Depressed, Hopeless 0  PHQ - 2 Score 1  Altered sleeping 2  Tired, decreased energy 2  Change in appetite 0  Feeling bad or failure about yourself  0  Trouble concentrating 1  Moving slowly or fidgety/restless 0  Suicidal thoughts 0  PHQ-9 Score 6   Wt Readings from Last 3 Encounters:  09/08/16 151 lb (68.5 kg)  03/11/16 150 lb (68 kg)  11/18/15 147 lb (66.7 kg)    Patient Active Problem List   Diagnosis Date Noted  . Abnormal cervical Papanicolaou smear 02/06/2015  . Allergic rhinitis 07/20/2014  . Anxiety, generalized 07/20/2014  . Cannot sleep 07/20/2014  . Affective disorder, major 07/20/2014  . Headache, migraine 07/20/2014  . Symptomatic menopausal or female climacteric states 07/20/2014    No past medical history on file.  Social History   Social History  . Marital status: Divorced    Spouse name: N/A  . Number of children: N/A  . Years of education: N/A   Occupational History  . Not on file.   Social History Main Topics  . Smoking status: Never Smoker  . Smokeless tobacco: Never Used  . Alcohol use No  . Drug use: No  . Sexual activity: Not on file   Other Topics Concern  . Not on file   Social History Narrative  . No narrative on file    Outpatient Encounter Prescriptions as of 09/08/2016  Medication Sig Note  . Melatonin 5 MG TABS Take by  mouth.   . naproxen (NAPROSYN) 500 MG tablet TAKE 1 TABLET BY MOUTH  TWICE A DAY AS NEEDED FOR  PAIN   . SUMAtriptan (IMITREX) 100 MG tablet Take 1 tablet (100 mg total) by mouth daily as needed for migraine.   . SUMAtriptan Succinate Refill 6 MG/0.5ML SOCT Inject 6 mg into the skin  once.   Marland Kitchen zolpidem (AMBIEN) 10 MG tablet Take 1 tablet (10 mg total) by mouth at bedtime as needed. for sleep   . cholecalciferol (VITAMIN D) 1000 UNITS tablet Take by mouth. Reported on 03/10/2015 07/20/2014: Received from: [facility]  . [DISCONTINUED] topiramate (TOPAMAX) 100 MG tablet TAKE 1 TABLET BY MOUTH  DAILY    No facility-administered encounter medications on file as of 09/08/2016.     Allergies  Allergen Reactions  . Codeine     Review of Systems  Constitutional: Negative.   Respiratory: Negative.   Cardiovascular: Negative.   Musculoskeletal: Positive for joint pain.  Psychiatric/Behavioral: The patient has insomnia.     Objective:  BP 98/64   Pulse 84   Temp 98 F (36.7 C)   Resp 14   Wt 151 lb (68.5 kg)   BMI 26.12 kg/m   Physical Exam  Constitutional: She is oriented to person, place, and time and well-developed, well-nourished, and in no distress.  HENT:  Head: Normocephalic and atraumatic.  Eyes: Pupils  are equal, round, and reactive to light. Conjunctivae are normal.  Neck: Normal range of motion. Neck supple.  Cardiovascular: Normal rate, regular rhythm, normal heart sounds and intact distal pulses.  Exam reveals no gallop.   No murmur heard. Pulmonary/Chest: Effort normal and breath sounds normal. No respiratory distress. She has no wheezes.  Musculoskeletal: She exhibits no edema or tenderness.  Neurological: She is alert and oriented to person, place, and time.  Psychiatric: Mood, memory, affect and judgment normal.    Assessment and Plan :  1. Other insomnia Stable on current treatment.  2. Other migraine without status migrainosus, not  intractable Stable. Decreasing. Follow.  3. Other hyperlipidemia Discussed with patient to work on diet and exercise. If levels continue to go up will need to consider starting medication.  HPI, Exam and A&P transcribed by Theressa Millard, RMA under direction and in the presence of Miguel Aschoff, MD. I have done the exam and reviewed the chart and it is accurate to the best of my knowledge. Development worker, community has been used and  any errors in dictation or transcription are unintentional. Miguel Aschoff M.D. [facility]

## 2016-09-09 ENCOUNTER — Telehealth: Payer: Self-pay | Admitting: Family Medicine

## 2016-09-09 NOTE — Telephone Encounter (Signed)
Ok to refer? Last colonoscopy was 03/09/13 and tubular adenoma was found, several polyps found. advised to schedule in 5 years-2020.-aa (record was in [place])

## 2016-09-09 NOTE — Telephone Encounter (Signed)
Would wait until 2020.

## 2016-09-09 NOTE — Telephone Encounter (Signed)
Pt stated that she thinks she is past due for her colonoscopy and she would like to be referred back to Dr. Allen Norris in [facility] because that is where she had her pervious colonoscopy. Please advise. Thanks TNP

## 2016-09-09 NOTE — Telephone Encounter (Signed)
Pt advised-aa 

## 2016-09-10 ENCOUNTER — Encounter: Payer: Self-pay | Admitting: Family Medicine

## 2016-10-19 ENCOUNTER — Other Ambulatory Visit: Payer: Self-pay | Admitting: Obstetrics and Gynecology

## 2016-10-19 ENCOUNTER — Ambulatory Visit
Admission: RE | Admit: 2016-10-19 | Discharge: 2016-10-19 | Disposition: A | Discharge status: Home / Self Care | Payer: Managed Care, Other (non HMO) | Source: Ambulatory Visit | Attending: Obstetrics and Gynecology | Admitting: Obstetrics and Gynecology

## 2016-10-19 DIAGNOSIS — Z1231 Encounter for screening mammogram for malignant neoplasm of breast: Secondary | ICD-10-CM | POA: Insufficient documentation

## 2016-11-22 ENCOUNTER — Other Ambulatory Visit: Payer: Self-pay | Admitting: Family Medicine

## 2017-02-03 ENCOUNTER — Other Ambulatory Visit: Payer: Self-pay | Admitting: Family Medicine

## 2017-02-03 DIAGNOSIS — G4709 Other insomnia: Secondary | ICD-10-CM

## 2017-02-03 NOTE — Telephone Encounter (Signed)
[facility] faxed a refill request for the following medication. Thanks CC  zolpidem (AMBIEN) 10 MG tablet

## 2017-02-08 MED ORDER — ZOLPIDEM TARTRATE 10 MG PO TABS: 10.0000 mg | ORAL_TABLET | Freq: Every evening | ORAL | 1 refills | Status: DC | PRN

## 2017-03-14 ENCOUNTER — Ambulatory Visit: Payer: Managed Care, Other (non HMO) | Admitting: Family Medicine

## 2017-03-14 ENCOUNTER — Other Ambulatory Visit: Payer: Self-pay

## 2017-03-14 VITALS — BP 118/68 | HR 98 | Temp 97.7°F | Resp 16 | Wt 156.0 lb

## 2017-03-14 DIAGNOSIS — R3129 Other microscopic hematuria: Secondary | ICD-10-CM

## 2017-03-14 DIAGNOSIS — F325 Major depressive disorder, single episode, in full remission: Secondary | ICD-10-CM | POA: Diagnosis not present

## 2017-03-14 DIAGNOSIS — G43809 Other migraine, not intractable, without status migrainosus: Secondary | ICD-10-CM | POA: Diagnosis not present

## 2017-03-14 DIAGNOSIS — F411 Generalized anxiety disorder: Secondary | ICD-10-CM

## 2017-03-14 DIAGNOSIS — E785 Hyperlipidemia, unspecified: Secondary | ICD-10-CM

## 2017-03-14 NOTE — Progress Notes (Signed)
Tammy Edwards  MRN: [id number] DOB: [birth year]  Subjective:  HPI   The patient is a 57 year old female who presents for follow up of depression. The was last seen on 09/08/16.  She was placed on Sertraline and states it has helped and she is doing well.   The patient states that her migraines have improved and her stress level is less than it has been in the past. In November she went to [place] with her 3 stepsons and enjoyed that trip. Patient would like top have her labs done today.  Patient Active Problem List   Diagnosis Date Noted  . Abnormal cervical Papanicolaou smear 02/06/2015  . Allergic rhinitis 07/20/2014  . Anxiety, generalized 07/20/2014  . Cannot sleep 07/20/2014  . Affective disorder, major 07/20/2014  . Headache, migraine 07/20/2014  . Symptomatic menopausal or female climacteric states 07/20/2014    No past medical history on file.  Social History   Socioeconomic History  . Marital status: Divorced    Spouse name: Not on file  . Number of children: Not on file  . Years of education: Not on file  . Highest education level: Not on file  Social Needs  . Financial resource strain: Not on file  . Food insecurity - worry: Not on file  . Food insecurity - inability: Not on file  . Transportation needs - medical: Not on file  . Transportation needs - non-medical: Not on file  Occupational History  . Not on file  Tobacco Use  . Smoking status: Never Smoker  . Smokeless tobacco: Never Used  Substance and Sexual Activity  . Alcohol use: No  . Drug use: No  . Sexual activity: Not on file  Other Topics Concern  . Not on file  Social History Narrative  . Not on file    Outpatient Encounter Medications as of 03/14/2017  Medication Sig Note  . cholecalciferol (VITAMIN D) 1000 UNITS tablet Take by mouth. Reported on 03/10/2015 07/20/2014: Received from: [facility]  . Melatonin 5 MG TABS Take by mouth.   . naproxen (NAPROSYN) 500 MG tablet TAKE  1 TABLET BY MOUTH  TWICE A DAY AS NEEDED FOR  PAIN   . sertraline (ZOLOFT) 100 MG tablet TAKE 1 TABLET BY MOUTH  EVERY DAY   . SUMAtriptan (IMITREX) 100 MG tablet Take 1 tablet (100 mg total) by mouth daily as needed for migraine.   . SUMAtriptan Succinate Refill 6 MG/0.5ML SOCT Inject 6 mg into the skin  once.   Marland Kitchen zolpidem (AMBIEN) 10 MG tablet Take 1 tablet (10 mg total) by mouth at bedtime as needed. for sleep   . [DISCONTINUED] topiramate (TOPAMAX) 100 MG tablet TAKE 1 TABLET BY MOUTH  DAILY    No facility-administered encounter medications on file as of 03/14/2017.     Allergies  Allergen Reactions  . Codeine     Review of Systems  Constitutional: Negative for fever and malaise/fatigue.  Eyes: Negative.   Respiratory: Negative for cough, shortness of breath and wheezing.   Cardiovascular: Negative for chest pain and palpitations.  Gastrointestinal: Negative.   Skin: Negative.   Neurological: Negative for dizziness, weakness and headaches.  Endo/Heme/Allergies: Negative.   Psychiatric/Behavioral: Negative.     Objective:  BP 118/68 (BP Location: Right Arm, Patient Position: Sitting, Cuff Size: Normal)   Pulse 98   Temp 97.7 F (36.5 C) (Oral)   Resp 16   Wt 156 lb (70.8 kg)   BMI 26.99 kg/m  Physical Exam  Constitutional: She is oriented to person, place, and time and well-developed, well-nourished, and in no distress.  HENT:  Head: Atraumatic.  Right Ear: External ear normal.  Left Ear: External ear normal.  Nose: Nose normal.  Eyes: Conjunctivae are normal. No scleral icterus.  Neck: No thyromegaly present.  Cardiovascular: Normal rate, regular rhythm and normal heart sounds.  Pulmonary/Chest: Effort normal and breath sounds normal.  Abdominal: Soft.  Neurological: She is alert and oriented to person, place, and time. Gait normal. GCS score is 15.  Skin: Skin is warm and dry.  Psychiatric: Mood, memory, affect and judgment normal.    Assessment and Plan :    1. Hyperlipidemia, unspecified hyperlipidemia type  - Lipid Panel With LDL/HDL Ratio  2. Microscopic hematuria  - Urinalysis, Routine w reflex microscopic; Future - Urinalysis, Routine w reflex microscopic  3. Depression, major, single episode, complete remission ([place])  - CBC with Differential/Platelet - Comprehensive metabolic panel - TSH 4.Migraine Headaches Better since on zoloft.  I have done the exam and reviewed the chart and it is accurate to the best of my knowledge. Development worker, community has been used and  any errors in dictation or transcription are unintentional. Miguel Aschoff M.D. [facility]

## 2017-03-15 LAB — URINALYSIS, ROUTINE W REFLEX MICROSCOPIC
BILIRUBIN UA: NEGATIVE
Glucose, UA: NEGATIVE
Ketones, UA: NEGATIVE
NITRITE UA: NEGATIVE
PH UA: 5.5 (ref 5.0–7.5)
Protein, UA: NEGATIVE
Specific Gravity, UA: 1.023 (ref 1.005–1.030)
UUROB: 0.2 mg/dL (ref 0.2–1.0)

## 2017-03-15 LAB — COMPREHENSIVE METABOLIC PANEL
A/G RATIO: 1.7 (ref 1.2–2.2)
ALK PHOS: 131 IU/L — AB (ref 39–117)
ALT: 16 IU/L (ref 0–32)
AST: 14 IU/L (ref 0–40)
Albumin: 4.3 g/dL (ref 3.5–5.5)
BILIRUBIN TOTAL: 0.2 mg/dL (ref 0.0–1.2)
BUN / CREAT RATIO: 17 (ref 9–23)
BUN: 13 mg/dL (ref 6–24)
CO2: 19 mmol/L — ABNORMAL LOW (ref 20–29)
Calcium: 9.2 mg/dL (ref 8.7–10.2)
Chloride: 107 mmol/L — ABNORMAL HIGH (ref 96–106)
Creatinine, Ser: 0.76 mg/dL (ref 0.57–1.00)
GFR calc non Af Amer: 89 mL/min/{1.73_m2} (ref >59)
GFR, EST AFRICAN AMERICAN: 103 mL/min/{1.73_m2} (ref >59)
GLOBULIN, TOTAL: 2.6 g/dL (ref 1.5–4.5)
Glucose: 89 mg/dL (ref 65–99)
Potassium: 4.5 mmol/L (ref 3.5–5.2)
SODIUM: 144 mmol/L (ref 134–144)
TOTAL PROTEIN: 6.9 g/dL (ref 6.0–8.5)

## 2017-03-15 LAB — LIPID PANEL WITH LDL/HDL RATIO
Cholesterol, Total: 283 mg/dL — ABNORMAL HIGH (ref 100–199)
HDL: 46 mg/dL (ref >39)
LDL CALC: 203 mg/dL — AB (ref 0–99)
LDl/HDL Ratio: 4.4 ratio — ABNORMAL HIGH (ref 0.0–3.2)
TRIGLYCERIDES: 171 mg/dL — AB (ref 0–149)
VLDL Cholesterol Cal: 34 mg/dL (ref 5–40)

## 2017-03-15 LAB — MICROSCOPIC EXAMINATION
Bacteria, UA: NONE SEEN
CASTS: NONE SEEN /LPF

## 2017-03-15 LAB — CBC WITH DIFFERENTIAL/PLATELET
BASOS ABS: 0 10*3/uL (ref 0.0–0.2)
BASOS: 1 %
EOS (ABSOLUTE): 0.2 10*3/uL (ref 0.0–0.4)
EOS: 2 %
HEMATOCRIT: 40.2 % (ref 34.0–46.6)
Hemoglobin: 13.1 g/dL (ref 11.1–15.9)
IMMATURE GRANS (ABS): 0 10*3/uL (ref 0.0–0.1)
Immature Granulocytes: 0 %
LYMPHS ABS: 2.1 10*3/uL (ref 0.7–3.1)
LYMPHS: 30 %
MCH: 30 pg (ref 26.6–33.0)
MCHC: 32.6 g/dL (ref 31.5–35.7)
MCV: 92 fL (ref 79–97)
MONOCYTES: 9 %
Monocytes Absolute: 0.6 10*3/uL (ref 0.1–0.9)
NEUTROS ABS: 4 10*3/uL (ref 1.4–7.0)
Neutrophils: 58 %
Platelets: 315 10*3/uL (ref 150–379)
RBC: 4.36 x10E6/uL (ref 3.77–5.28)
RDW: 14.4 % (ref 12.3–15.4)
WBC: 7 10*3/uL (ref 3.4–10.8)

## 2017-03-15 LAB — TSH: TSH: 2.54 u[IU]/mL (ref 0.450–4.500)

## 2017-03-21 ENCOUNTER — Telehealth: Payer: Self-pay | Admitting: Emergency Medicine

## 2017-03-21 MED ORDER — ROSUVASTATIN CALCIUM 10 MG PO TABS: 10.0000 mg | ORAL_TABLET | Freq: Every day | ORAL | 3 refills | Status: DC

## 2017-03-21 NOTE — Telephone Encounter (Signed)
Patient is returning your call and requesting a call back at work.  [phone]

## 2017-03-21 NOTE — Telephone Encounter (Signed)
Patient advised as below. Patient verbalizes understanding and is in agreement with treatment plan.  

## 2017-03-21 NOTE — Telephone Encounter (Signed)
-----   Message from Jerrol Banana., MD sent at 03/17/2017  3:10 PM EST ----- Cholesterol too high--start crestor 10mg  daily--RTC 1 month. Also will f/u on very mild microscopic hematuria then.

## 2017-03-21 NOTE — Telephone Encounter (Signed)
LMTCB

## 2017-04-18 ENCOUNTER — Ambulatory Visit: Payer: Managed Care, Other (non HMO) | Admitting: Family Medicine

## 2017-04-18 ENCOUNTER — Encounter: Payer: Self-pay | Admitting: Family Medicine

## 2017-04-18 DIAGNOSIS — R319 Hematuria, unspecified: Secondary | ICD-10-CM

## 2017-04-18 DIAGNOSIS — E785 Hyperlipidemia, unspecified: Secondary | ICD-10-CM

## 2017-04-18 DIAGNOSIS — Z114 Encounter for screening for human immunodeficiency virus [HIV]: Secondary | ICD-10-CM | POA: Diagnosis not present

## 2017-04-18 LAB — POCT URINALYSIS DIPSTICK
BILIRUBIN UA: NEGATIVE
GLUCOSE UA: NEGATIVE
Ketones, UA: NEGATIVE
Leukocytes, UA: NEGATIVE
Nitrite, UA: NEGATIVE
Urobilinogen, UA: 0.2 E.U./dL
pH, UA: 5 (ref 5.0–8.0)

## 2017-04-18 NOTE — Progress Notes (Signed)
Patient: Tammy Edwards Female    DOB: November 21, 1960   57 y.o.   MRN: [id number] Visit Date: 04/18/2017  Today's Provider: Wilhemena Durie, MD   Chief Complaint  Patient presents with  . Hyperlipidemia  . Hematuria  . Follow-up   Subjective:    HPI  Lipid/Cholesterol, Follow-up:   Last seen for this1 months ago.  Management changes since that visit include started Crestor 10 mg daily. . Last Lipid Panel:    Component Value Date/Time   CHOL 283 (H) 03/14/2017 0846   TRIG 171 (H) 03/14/2017 0846   HDL 46 03/14/2017 0846   LDLCALC 203 (H) 03/14/2017 0846    Risk factors for vascular disease include hypercholesterolemia  She reports good compliance with treatment. She is not having side effects.  Current symptoms include none  Weight trend: stable Current diet: in general, an "unhealthy" diet Current exercise: none  Wt Readings from Last 3 Encounters:  03/14/17 156 lb (70.8 kg)  09/08/16 151 lb (68.5 kg)  03/11/16 150 lb (68 kg)    ------------------------------------------------------------------- Hematuria: Patient presents for a 1 month follow up. Last OV on 03/14/17. Microscopic examination showed mild hematuria. Patient advised to follow up and recheck urine. She had full evaluation at [place] year ago.    Allergies  Allergen Reactions  . Codeine      Current Outpatient Medications:  .  cholecalciferol (VITAMIN D) 1000 UNITS tablet, Take by mouth. Reported on 03/10/2015, Disp: , Rfl:  .  Melatonin 5 MG TABS, Take by mouth., Disp: , Rfl:  .  naproxen (NAPROSYN) 500 MG tablet, TAKE 1 TABLET BY MOUTH  TWICE A DAY AS NEEDED FOR  PAIN, Disp: 180 tablet, Rfl: 3 .  rosuvastatin (CRESTOR) 10 MG tablet, Take 1 tablet (10 mg total) by mouth daily., Disp: 90 tablet, Rfl: 3 .  sertraline (ZOLOFT) 100 MG tablet, TAKE 1 TABLET BY MOUTH  EVERY DAY, Disp: 90 tablet, Rfl: 3 .  SUMAtriptan (IMITREX) 100 MG tablet, Take 1 tablet (100 mg total) by mouth daily as  needed for migraine., Disp: 27 tablet, Rfl: 3 .  SUMAtriptan Succinate Refill 6 MG/0.5ML SOCT, Inject 6 mg into the skin  once., Disp: 18 Cartridge, Rfl: 3 .  zolpidem (AMBIEN) 10 MG tablet, Take 1 tablet (10 mg total) by mouth at bedtime as needed. for sleep, Disp: 90 tablet, Rfl: 1  Review of Systems  Constitutional: Negative.   Respiratory: Negative.   Cardiovascular: Negative.   Endocrine: Negative.   Genitourinary: Negative.   Psychiatric/Behavioral: Negative.     Social History   Tobacco Use  . Smoking status: Never Smoker  . Smokeless tobacco: Never Used  Substance Use Topics  . Alcohol use: No   Objective:   There were no vitals taken for this visit.   Physical Exam  Constitutional: She is oriented to person, place, and time. She appears well-developed and well-nourished.  HENT:  Head: Normocephalic and atraumatic.  Right Ear: External ear normal.  Left Ear: External ear normal.  Mouth/Throat: Oropharynx is clear and moist.  Eyes: Conjunctivae and EOM are normal. Pupils are equal, round, and reactive to light.  Neck: Neck supple.  Cardiovascular: Normal rate, regular rhythm, normal heart sounds and intact distal pulses.  Pulmonary/Chest: Effort normal and breath sounds normal.  Abdominal: Soft. Bowel sounds are normal.  No CVAT.  Musculoskeletal: Normal range of motion.  Neurological: She is alert and oriented to person, place, and time. She has normal reflexes.  Skin: Skin is warm and dry.  Psychiatric: She has a normal mood and affect. Her behavior is normal. Judgment and thought content normal.        Assessment & Plan:     1. Hyperlipidemia, unspecified hyperlipidemia type Continue Crestor 10 mg, diet and exercise. Follow up in 6 months - Lipid Profile  2. Hematuria, unspecified type No RBC/WBC on microscopic exam today only epithelial cells. Sent for culture. No further w/u as this was done at [facility] 1 year ago.   - POCT Urinalysis Dipstick - Urine  Culture  3. Encounter for screening for HIV - HIV antibody     I have done the exam and reviewed the chart and it is accurate to the best of my knowledge. Development worker, community has been used and  any errors in dictation or transcription are unintentional. Miguel Aschoff M.D. [place], MD  [facility]

## 2017-04-19 LAB — HIV ANTIBODY (ROUTINE TESTING W REFLEX): HIV Screen 4th Generation wRfx: NONREACTIVE

## 2017-04-19 LAB — LIPID PANEL
Chol/HDL Ratio: 3.1 ratio (ref 0.0–4.4)
Cholesterol, Total: 205 mg/dL — ABNORMAL HIGH (ref 100–199)
HDL: 66 mg/dL
LDL Calculated: 114 mg/dL — ABNORMAL HIGH (ref 0–99)
Triglycerides: 124 mg/dL (ref 0–149)
VLDL Cholesterol Cal: 25 mg/dL (ref 5–40)

## 2017-04-20 LAB — URINE CULTURE: ORGANISM ID, BACTERIA: NO GROWTH

## 2017-04-21 ENCOUNTER — Telehealth: Payer: Self-pay

## 2017-04-21 NOTE — Telephone Encounter (Signed)
Pt advised.   Thanks,   -Ladiamond Gallina  

## 2017-04-21 NOTE — Telephone Encounter (Signed)
-----   Message from Jerrol Banana., MD sent at 04/21/2017  2:07 PM EDT ----- Urine ok--Lipids much better.

## 2017-06-14 ENCOUNTER — Other Ambulatory Visit: Payer: Self-pay | Admitting: Family Medicine

## 2017-06-14 DIAGNOSIS — G4709 Other insomnia: Secondary | ICD-10-CM

## 2017-06-15 ENCOUNTER — Other Ambulatory Visit: Payer: Self-pay | Admitting: Family Medicine

## 2017-06-15 MED ORDER — ROSUVASTATIN CALCIUM 10 MG PO TABS: 10.0000 mg | ORAL_TABLET | Freq: Every day | ORAL | 3 refills | Status: DC

## 2017-06-15 NOTE — Telephone Encounter (Signed)
[facility] pharmacy faxed a refill request for the following medication. Thanks CC  rosuvastatin (CRESTOR) 10 MG tablet

## 2017-06-15 NOTE — Addendum Note (Signed)
Addended by: Althea Charon D on: 06/15/2017 10:32 AM   Modules accepted: Orders

## 2017-08-12 ENCOUNTER — Other Ambulatory Visit: Payer: Self-pay | Admitting: Family Medicine

## 2017-08-12 DIAGNOSIS — G43809 Other migraine, not intractable, without status migrainosus: Secondary | ICD-10-CM

## 2017-08-16 ENCOUNTER — Other Ambulatory Visit: Payer: Self-pay | Admitting: Family Medicine

## 2017-09-13 ENCOUNTER — Telehealth: Payer: Self-pay | Admitting: Family Medicine

## 2017-09-13 NOTE — Telephone Encounter (Signed)
Call back # is [phone]   REF [id number]  The pharmacy has 1 rx. For Imitrex tablets and a rx for Injectable.  Does she need both?  This is a Dr. Darnell Level patient.

## 2017-09-14 ENCOUNTER — Other Ambulatory Visit: Payer: Self-pay | Admitting: Family Medicine

## 2017-09-14 DIAGNOSIS — G43809 Other migraine, not intractable, without status migrainosus: Secondary | ICD-10-CM

## 2017-09-14 NOTE — Telephone Encounter (Signed)
Please advise 

## 2017-09-14 NOTE — Telephone Encounter (Signed)
I don't know. Can call patient and see if she takes both.

## 2017-09-15 ENCOUNTER — Other Ambulatory Visit: Payer: Self-pay

## 2017-09-15 NOTE — Telephone Encounter (Signed)
The patient does use both.  However, according to the record it looks like she has used 72 injections and 108 pills in about a years time.  I wanted to make sure he was aware of this before it was ok'ed to be refilled.  I don't think he realizes she is using it this much.  Please just leave this in the nurse box until he comes back.  Thanks

## 2017-10-19 ENCOUNTER — Ambulatory Visit: Payer: Managed Care, Other (non HMO) | Admitting: Family Medicine

## 2017-10-19 VITALS — BP 120/80 | HR 96 | Temp 98.0°F | Resp 16 | Wt 162.0 lb

## 2017-10-19 DIAGNOSIS — Z23 Encounter for immunization: Secondary | ICD-10-CM | POA: Diagnosis not present

## 2017-10-19 DIAGNOSIS — F3341 Major depressive disorder, recurrent, in partial remission: Secondary | ICD-10-CM

## 2017-10-19 DIAGNOSIS — G43809 Other migraine, not intractable, without status migrainosus: Secondary | ICD-10-CM | POA: Diagnosis not present

## 2017-10-19 NOTE — Progress Notes (Signed)
Natali Lavallee  MRN: [id number] DOB: December 14, 1960  Subjective:  HPI   The patient is a 57 year old female who presents for follow up of chronic health.  She was last seen on 04/18/17.  Hyperlipidemia-Instructed on last visit to continue with her Crestor and f/u in 6 months.  Patient complains that she has been having cramps but she had been having them before starting the Crestor and does not see that they have increased or that they are intolerable.   Lab Results  Component Value Date   CHOL 205 (H) 04/18/2017   HDL 66 04/18/2017   LDLCALC 114 (H) 04/18/2017   TRIG 124 04/18/2017   CHOLHDL 3.1 04/18/2017   Headaches-Patient takes Imitrex oral and injection.  On her last refill request it was noticed that she has been taking medication frequently and provider wanted to discuss this with her on this visit.  Patient states that she has not been taking all the medicine she has been getting.  She said that her pharmacy has her on automatic refill and they send it even if she doesn't ask for it.   Patient Active Problem List   Diagnosis Date Noted  . Abnormal cervical Papanicolaou smear 02/06/2015  . Allergic rhinitis 07/20/2014  . Anxiety, generalized 07/20/2014  . Cannot sleep 07/20/2014  . Affective disorder, major 07/20/2014  . Headache, migraine 07/20/2014  . Symptomatic menopausal or female climacteric states 07/20/2014    No past medical history on file.  Social History   Socioeconomic History  . Marital status: Divorced    Spouse name: Not on file  . Number of children: Not on file  . Years of education: Not on file  . Highest education level: Not on file  Occupational History  . Not on file  Social Needs  . Financial resource strain: Not on file  . Food insecurity:    Worry: Not on file    Inability: Not on file  . Transportation needs:    Medical: Not on file    Non-medical: Not on file  Tobacco Use  . Smoking status: Never Smoker  . Smokeless tobacco: Never  Used  Substance and Sexual Activity  . Alcohol use: No  . Drug use: No  . Sexual activity: Not on file  Lifestyle  . Physical activity:    Days per week: Not on file    Minutes per session: Not on file  . Stress: Not on file  Relationships  . Social connections:    Talks on phone: Not on file    Gets together: Not on file    Attends religious service: Not on file    Active member of club or organization: Not on file    Attends meetings of clubs or organizations: Not on file    Relationship status: Not on file  . Intimate partner violence:    Fear of current or ex partner: Not on file    Emotionally abused: Not on file    Physically abused: Not on file    Forced sexual activity: Not on file  Other Topics Concern  . Not on file  Social History Narrative  . Not on file    Outpatient Encounter Medications as of 10/19/2017  Medication Sig Note  . cholecalciferol (VITAMIN D) 1000 UNITS tablet Take by mouth. Reported on 03/10/2015 07/20/2014: Received from: [employer]  . Melatonin 5 MG TABS Take by mouth.   . naproxen (NAPROSYN) 500 MG tablet TAKE 1 TABLET BY MOUTH  TWICE A DAY AS NEEDED FOR  PAIN   . rosuvastatin (CRESTOR) 10 MG tablet Take 1 tablet (10 mg total) by mouth daily.   . SUMAtriptan (IMITREX) 100 MG tablet TAKE 1 TABLET BY MOUTH  DAILY AS NEEDED FOR  MIGRAINE.   Marland Kitchen SUMAtriptan Succinate Refill 6 MG/0.5ML SOCT INJECT 6 MG INTO THE SKIN  ONCE   . zolpidem (AMBIEN) 10 MG tablet TAKE 1 TABLET BY MOUTH AT  BEDTIME AS NEEDED FOR SLEEP   . sertraline (ZOLOFT) 100 MG tablet TAKE 1 TABLET BY MOUTH  EVERY DAY (Patient not taking: Reported on 10/19/2017)    No facility-administered encounter medications on file as of 10/19/2017.     Allergies  Allergen Reactions  . Codeine     Review of Systems  Constitutional: Negative for fever and malaise/fatigue.  Respiratory: Negative for cough, shortness of breath and wheezing.   Cardiovascular: Negative for chest  pain, palpitations, orthopnea and leg swelling.  Gastrointestinal: Negative.   Skin: Negative.   Neurological: Positive for headaches (averaging 1 per week with need for injection).  Psychiatric/Behavioral:       Irritable lately at work.    Objective:  BP 120/80 (BP Location: Right Arm, Patient Position: Sitting, Cuff Size: Normal)   Pulse 96   Temp 98 F (36.7 C) (Oral)   Resp 16   Wt 162 lb (73.5 kg)   BMI 28.03 kg/m   Physical Exam  Constitutional: She is oriented to person, place, and time and well-developed, well-nourished, and in no distress.  HENT:  Head: Normocephalic and atraumatic.  Right Ear: External ear normal.  Left Ear: External ear normal.  Nose: Nose normal.  Eyes: Conjunctivae are normal. No scleral icterus.  Neck: No thyromegaly present.  Cardiovascular: Normal rate, regular rhythm and normal heart sounds.  Pulmonary/Chest: Effort normal and breath sounds normal.  Abdominal: Soft.  Musculoskeletal: She exhibits no edema.  Neurological: She is alert and oriented to person, place, and time. Gait normal. GCS score is 15.  Skin: Skin is warm and dry.  Psychiatric: Mood, memory, affect and judgment normal.    Assessment and Plan :  1. Need for influenza vaccination  - Flu Vaccine QUAD 6+ mos PF IM (Fluarix Quad PF) 1. Need for influenza vaccination  - Flu Vaccine QUAD 6+ mos PF IM (Fluarix Quad PF)  2. Other migraine without status migrainosus, not intractable Advised to take Imitrex no more than once a week.    3. Recurrent major depressive disorder, in partial remission (HCC) Restart sertraline.  Return to clinic 2 months  I have done the exam and reviewed the chart and it is accurate to the best of my knowledge. Development worker, community has been used and  any errors in dictation or transcription are unintentional. Miguel Aschoff M.D. [facility]

## 2017-10-25 ENCOUNTER — Other Ambulatory Visit: Payer: Self-pay | Admitting: Obstetrics and Gynecology

## 2017-10-25 DIAGNOSIS — Z1231 Encounter for screening mammogram for malignant neoplasm of breast: Secondary | ICD-10-CM

## 2017-11-09 ENCOUNTER — Encounter: Payer: Self-pay | Admitting: Family Medicine

## 2017-11-23 ENCOUNTER — Other Ambulatory Visit: Payer: Self-pay | Admitting: Family Medicine

## 2017-11-23 DIAGNOSIS — G4709 Other insomnia: Secondary | ICD-10-CM

## 2017-12-29 ENCOUNTER — Ambulatory Visit: Payer: Self-pay | Admitting: Family Medicine

## 2018-01-27 ENCOUNTER — Other Ambulatory Visit: Payer: Self-pay | Admitting: Family Medicine

## 2018-04-05 ENCOUNTER — Other Ambulatory Visit: Payer: Self-pay | Admitting: Family Medicine

## 2018-05-21 ENCOUNTER — Other Ambulatory Visit: Payer: Self-pay | Admitting: Family Medicine

## 2018-05-21 DIAGNOSIS — G43809 Other migraine, not intractable, without status migrainosus: Secondary | ICD-10-CM

## 2018-05-21 DIAGNOSIS — G4709 Other insomnia: Secondary | ICD-10-CM

## 2018-07-17 ENCOUNTER — Other Ambulatory Visit: Payer: Self-pay | Admitting: Family Medicine

## 2018-07-17 DIAGNOSIS — G43809 Other migraine, not intractable, without status migrainosus: Secondary | ICD-10-CM

## 2018-07-17 NOTE — Telephone Encounter (Signed)
Please review

## 2018-08-12 ENCOUNTER — Other Ambulatory Visit: Payer: Self-pay | Admitting: Family Medicine

## 2018-10-13 ENCOUNTER — Other Ambulatory Visit: Payer: Self-pay

## 2018-10-13 DIAGNOSIS — G43809 Other migraine, not intractable, without status migrainosus: Secondary | ICD-10-CM

## 2018-10-13 MED ORDER — SUMATRIPTAN SUCCINATE 100 MG PO TABS: 100.0000 mg | ORAL_TABLET | ORAL | 3 refills | Status: DC | PRN

## 2018-10-13 NOTE — Telephone Encounter (Signed)
Mail order pharmacy requesting refills. Please review. Thanks!

## 2018-10-31 ENCOUNTER — Ambulatory Visit
Admission: RE | Admit: 2018-10-31 | Discharge: 2018-10-31 | Disposition: A | Discharge status: Home / Self Care | Payer: Managed Care, Other (non HMO) | Source: Ambulatory Visit | Attending: Obstetrics and Gynecology | Admitting: Obstetrics and Gynecology

## 2018-10-31 ENCOUNTER — Other Ambulatory Visit: Payer: Self-pay | Admitting: Obstetrics and Gynecology

## 2018-10-31 DIAGNOSIS — Z1231 Encounter for screening mammogram for malignant neoplasm of breast: Secondary | ICD-10-CM | POA: Insufficient documentation

## 2018-11-13 ENCOUNTER — Other Ambulatory Visit: Payer: Self-pay | Admitting: Family Medicine

## 2018-11-13 DIAGNOSIS — G43809 Other migraine, not intractable, without status migrainosus: Secondary | ICD-10-CM

## 2019-02-25 ENCOUNTER — Other Ambulatory Visit: Payer: Self-pay | Admitting: Family Medicine

## 2019-03-13 ENCOUNTER — Other Ambulatory Visit: Payer: Self-pay | Admitting: Family Medicine

## 2019-03-14 NOTE — Telephone Encounter (Signed)
Requested medication (s) are due for refill today: yes  Requested medication (s) are on the active medication list: yes  Last refill:  01/05/2019  Future visit scheduled: no  Notes to clinic:  no valid encounter in last 12 months    Requested Prescriptions  Pending Prescriptions Disp Refills   rosuvastatin (CRESTOR) 10 MG tablet [Pharmacy Med Name: ROSUVASTATIN  10MG   TAB] 90 tablet 3    Sig: TAKE 1 TABLET BY MOUTH  DAILY      Cardiovascular:  Antilipid - Statins Failed - 03/13/2019  9:14 PM      Failed - Total Cholesterol in normal range and within 360 days    Cholesterol, Total  Date Value Ref Range Status  04/18/2017 205 (H) 100 - 199 mg/dL Final          Failed - LDL in normal range and within 360 days    LDL Calculated  Date Value Ref Range Status  04/18/2017 114 (H) 0 - 99 mg/dL Final          Failed - HDL in normal range and within 360 days    HDL  Date Value Ref Range Status  04/18/2017 66 >39 mg/dL Final          Failed - Triglycerides in normal range and within 360 days    Triglycerides  Date Value Ref Range Status  04/18/2017 124 0 - 149 mg/dL Final          Failed - Valid encounter within last 12 months    Recent Outpatient Visits           1 year ago Need for influenza vaccination   [facility] Jerrol Banana., [facility]   1 year ago Hyperlipidemia, unspecified hyperlipidemia type   [facility] Jerrol Banana., MD   2 years ago Hyperlipidemia, unspecified hyperlipidemia type   [facility] Jerrol Banana., MD   2 years ago Other insomnia   [facility] Jerrol Banana., MD   3 years ago Other insomnia   [facility] Jerrol Banana., MD              Passed - Patient is not pregnant

## 2019-03-14 NOTE — Telephone Encounter (Signed)
Please advise? Last office visit was 10/19/2017.

## 2019-04-06 ENCOUNTER — Other Ambulatory Visit: Payer: Self-pay | Admitting: Family Medicine

## 2019-04-06 DIAGNOSIS — G4709 Other insomnia: Secondary | ICD-10-CM

## 2019-04-06 DIAGNOSIS — E785 Hyperlipidemia, unspecified: Secondary | ICD-10-CM

## 2019-04-06 NOTE — Telephone Encounter (Signed)
LMOVM for pt to call back to scheduled an office visit.

## 2019-04-06 NOTE — Telephone Encounter (Signed)
Requested medication (s) are due for refill today: Rosuvastatin, yes  Requested medication (s) are on the active medication list: yes  Last refill:  03/14/19  Future visit scheduled: no   Notes to clinic:  no valid encounter within last 12 months  Requested medication (s) are due for refill today: Zolpidem, yes  Requested medication (s) are on the active medication list: yes  Last refill:  11/13/2018  Future visit scheduled: no  Notes to clinic:  not delegated  Requested Prescriptions  Pending Prescriptions Disp Refills   rosuvastatin (CRESTOR) 10 MG tablet [Pharmacy Med Name: ROSUVASTATIN  10MG   TAB] 90 tablet 3    Sig: TAKE 1 TABLET BY MOUTH  DAILY      Cardiovascular:  Antilipid - Statins Failed - 04/06/2019  7:36 AM      Failed - Total Cholesterol in normal range and within 360 days    Cholesterol, Total  Date Value Ref Range Status  04/18/2017 205 (H) 100 - 199 mg/dL Final          Failed - LDL in normal range and within 360 days    LDL Calculated  Date Value Ref Range Status  04/18/2017 114 (H) 0 - 99 mg/dL Final          Failed - HDL in normal range and within 360 days    HDL  Date Value Ref Range Status  04/18/2017 66 >39 mg/dL Final          Failed - Triglycerides in normal range and within 360 days    Triglycerides  Date Value Ref Range Status  04/18/2017 124 0 - 149 mg/dL Final          Failed - Valid encounter within last 12 months    Recent Outpatient Visits           1 year ago Need for influenza vaccination   [facility] Jerrol Banana., MD   1 year ago Hyperlipidemia, unspecified hyperlipidemia type   [facility] Jerrol Banana., MD   2 years ago Hyperlipidemia, unspecified hyperlipidemia type   [facility] Jerrol Banana., MD   2 years ago Other insomnia   [facility] Jerrol Banana., MD   3 years ago Other insomnia   [facility]  Jerrol Banana., MD              Passed - Patient is not pregnant        zolpidem (AMBIEN) 10 MG tablet [Pharmacy Med Name: ZOLPIDEM  10MG   TAB] 90 tablet     Sig: TAKE 1 TABLET BY MOUTH AT  BEDTIME AS NEEDED FOR SLEEP      Not Delegated - Psychiatry:  Anxiolytics/Hypnotics Failed - 04/06/2019  7:36 AM      Failed - This refill cannot be delegated      Failed - Urine Drug Screen completed in last 360 days.      Failed - Valid encounter within last 6 months    Recent Outpatient Visits           1 year ago Need for influenza vaccination   [facility] Jerrol Banana., MD   1 year ago Hyperlipidemia, unspecified hyperlipidemia type   [facility] Jerrol Banana., MD   2 years ago Hyperlipidemia, unspecified hyperlipidemia type   [facility] Jerrol Banana., MD   2 years ago Other insomnia  [facility] Jerrol Banana., MD   3 years ago Other insomnia   [facility] Jerrol Banana., MD

## 2019-04-06 NOTE — Telephone Encounter (Signed)
Patient's last visit with [place] was on 10/2017, last lipid check was 04/2017 and patient has no f/u OV in our office.

## 2019-04-09 NOTE — Telephone Encounter (Signed)
Please advise refill? 

## 2019-04-15 ENCOUNTER — Ambulatory Visit: Payer: Managed Care, Other (non HMO) | Attending: Internal Medicine

## 2019-04-15 DIAGNOSIS — Z23 Encounter for immunization: Secondary | ICD-10-CM | POA: Insufficient documentation

## 2019-04-15 NOTE — Progress Notes (Signed)
   Covid-19 Vaccination Clinic  Name:  Anastasia Cuccio    MRN: [id number] DOB: 11-18-1960  04/15/2019  Ms. Avilla was observed post Covid-19 immunization for 15 minutes without incident. She was provided with Vaccine Information Sheet and instruction to access the V-Safe system.   Ms. Boisvert was instructed to call 911 with any severe reactions post vaccine: Marland Kitchen Difficulty breathing  . Swelling of face and throat  . A fast heartbeat  . A bad rash all over body  . Dizziness and weakness   Immunizations Administered    Name Date Dose VIS Date Route   Pfizer COVID-19 Vaccine 04/15/2019  8:57 AM 0.3 mL 01/19/2019 Intramuscular   Manufacturer: [place]   Lot: [id number]   [place]: [id number]

## 2019-05-04 ENCOUNTER — Other Ambulatory Visit: Payer: Self-pay | Admitting: Family Medicine

## 2019-05-04 NOTE — Telephone Encounter (Signed)
Requested  medications are  due for refill today yes  Requested medications are on the active medication list yes  Last refill 2/3  Last visit 10/2017  Future visit scheduled no  Notes to clinic failed protocol due to no visit since 10/2017 and was given 1 months supply last month.

## 2019-05-06 NOTE — Telephone Encounter (Signed)
LOV  10/19/17  Last lipid  04/18/17  LRF  03/14/19 90 x 0

## 2019-05-07 ENCOUNTER — Ambulatory Visit: Payer: Managed Care, Other (non HMO)

## 2019-05-08 ENCOUNTER — Ambulatory Visit: Payer: Managed Care, Other (non HMO) | Attending: Internal Medicine

## 2019-05-08 DIAGNOSIS — Z23 Encounter for immunization: Secondary | ICD-10-CM

## 2019-05-08 NOTE — Progress Notes (Signed)
   Covid-19 Vaccination Clinic  Name:  Ruchama Wilkowski    MRN: [id number] DOB: Apr 22, 1960  05/08/2019  Ms. Speagle was observed post Covid-19 immunization for 15 minutes without incident. She was provided with Vaccine Information Sheet and instruction to access the V-Safe system.   Ms. Najafi was instructed to call 911 with any severe reactions post vaccine: Marland Kitchen Difficulty breathing  . Swelling of face and throat  . A fast heartbeat  . A bad rash all over body  . Dizziness and weakness   Immunizations Administered    Name Date Dose VIS Date Route   Pfizer COVID-19 Vaccine 05/08/2019 12:48 PM 0.3 mL 01/19/2019 Intramuscular   Manufacturer: [place]   Lot: [id number]   [place]: [id number]

## 2019-07-27 ENCOUNTER — Other Ambulatory Visit: Payer: Self-pay | Admitting: Family Medicine

## 2019-07-27 NOTE — Telephone Encounter (Signed)
Refill to last until appointment in July.

## 2019-08-08 NOTE — Progress Notes (Addendum)
Established patient visit   Patient: Tammy Edwards   DOB: 1960/02/14   59 y.o. Female  MRN: [id number] Visit Date: 08/20/2019  I,Sulibeya S Dimas,acting as a scribe for Wilhemena Durie, MD.,have documented all relevant documentation on the behalf of Wilhemena Durie, MD,as directed by  Wilhemena Durie, MD while in the presence of Wilhemena Durie, MD.  Today's healthcare provider: Wilhemena Durie, MD   Chief Complaint  Patient presents with  . Depression  . Hyperlipidemia  . Migraine  . Insomnia   Subjective    HPI  Patient has not been in today for follow-up for 22 months.  She works for [employer] and has been working from home since [employer] started and is ready to get back to the office.  She is starting to get agitated about it.  Emotionally she states she is stable.  Headaches are also stable.  She needs refill on medications. Lipid/Cholesterol, Follow-up  Last lipid panel Other pertinent labs  Lab Results  Component Value Date   CHOL 205 (H) 04/18/2017   HDL 66 04/18/2017   LDLCALC 114 (H) 04/18/2017   TRIG 124 04/18/2017   CHOLHDL 3.1 04/18/2017   Lab Results  Component Value Date   ALT 16 03/14/2017   AST 14 03/14/2017   PLT 315 03/14/2017   TSH 2.540 03/14/2017     She was last seen for this over 3 years ago.  Management since that visit includes no changes.  She reports excellent compliance with treatment. She is not having side effects.   Current diet: in general, a "healthy" diet   Current exercise: none ---------------------------------------------------------------------------------------------------  Follow up for Depression  The patient was last seen for this 3 years ago. Changes made at last visit include patient has stopped taking medication. Patient reports she has been working from home and that makes her feel depressed.  Depression screen [facility] 2/9 08/20/2019 09/08/2016  Decreased Interest 3 1  Down, Depressed, Hopeless 2 0  PHQ -  2 Score 5 1  Altered sleeping 2 2  Tired, decreased energy 2 2  Change in appetite 3 0  Feeling bad or failure about yourself  0 0  Trouble concentrating 0 1  Moving slowly or fidgety/restless 0 0  Suicidal thoughts 0 0  PHQ-9 Score 12 6  Difficult doing work/chores Somewhat difficult -    -----------------------------------------------------------------------------------------  Follow up for Migraines  The patient was last seen for this 3 years ago. Changes made at last visit include no changes.  She reports excellent compliance with treatment. She feels that condition is Improved. She is not having side effects.   -----------------------------------------------------------------------------------------  Follow up for insomnia  The patient was last seen for this 3 years ago. Changes made at last visit include no changes.  She reports excellent compliance with treatment. She feels that condition is Improved. She is not having side effects.   -----------------------------------------------------------------------------------------    Medications: Outpatient Medications Prior to Visit  Medication Sig  . cholecalciferol (VITAMIN D) 1000 UNITS tablet Take by mouth. Reported on 03/10/2015  . SUMAtriptan (IMITREX) 100 MG tablet Take 1 tablet (100 mg total) by mouth every 2 (two) hours as needed for migraine. May repeat in 2 hours if headache persists or recurs.  . [DISCONTINUED] rosuvastatin (CRESTOR) 10 MG tablet Take 1 tablet (10 mg total) by mouth daily.  . [DISCONTINUED] SUMAtriptan Succinate Refill 6 MG/0.5ML SOCT INJECT 6MG  INTO THE SKIN  ONCE AS DIRECTED  . [DISCONTINUED]  topiramate (TOPAMAX) 100 MG tablet TAKE 1 TABLET BY MOUTH  DAILY  . [DISCONTINUED] Melatonin 5 MG TABS Take by mouth. (Patient not taking: Reported on 08/20/2019)  . [DISCONTINUED] naproxen (NAPROSYN) 500 MG tablet TAKE 1 TABLET BY MOUTH  TWICE A DAY AS NEEDED FOR  PAIN  . [DISCONTINUED] sertraline  (ZOLOFT) 100 MG tablet TAKE 1 TABLET BY MOUTH  EVERY DAY  . [DISCONTINUED] zolpidem (AMBIEN) 10 MG tablet TAKE 1 TABLET BY MOUTH AT  BEDTIME AS NEEDED FOR SLEEP   No facility-administered medications prior to visit.    Review of Systems  Constitutional: Negative for appetite change, chills, fatigue and fever.  Respiratory: Negative for chest tightness and shortness of breath.   Cardiovascular: Negative for chest pain and palpitations.  Gastrointestinal: Negative for abdominal pain, nausea and vomiting.  Musculoskeletal: Negative.   Skin: Negative.   Neurological: Negative for dizziness and weakness.  Psychiatric/Behavioral: Positive for decreased concentration, dysphoric mood and sleep disturbance. Negative for suicidal ideas.       Objective    BP (!) 89/61 (BP Location: Right Arm, Patient Position: Sitting, Cuff Size: Normal)   Pulse (!) 103   Temp (!) 97.3 F (36.3 C)   Resp 16   Ht 5\' 3"  (1.6 m)   Wt 140 lb 3.2 oz (63.6 kg)   BMI 24.84 kg/m  BP Readings from Last 3 Encounters:  08/20/19 (!) 89/61  10/19/17 120/80  03/14/17 118/68   Wt Readings from Last 3 Encounters:  08/20/19 140 lb 3.2 oz (63.6 kg)  10/19/17 162 lb (73.5 kg)  03/14/17 156 lb (70.8 kg)    Physical Exam Vitals reviewed.  Constitutional:      Appearance: Normal appearance.  HENT:     Head: Normocephalic and atraumatic.     Right Ear: External ear normal.     Left Ear: External ear normal.     Mouth/Throat:     Pharynx: Oropharynx is clear.  Eyes:     General: No scleral icterus.    Conjunctiva/sclera: Conjunctivae normal.  Cardiovascular:     Rate and Rhythm: Normal rate and regular rhythm.     Pulses: Normal pulses.     Heart sounds: Normal heart sounds.  Pulmonary:     Effort: Pulmonary effort is normal.     Breath sounds: Normal breath sounds.  Abdominal:     Palpations: Abdomen is soft.  Musculoskeletal:     Right lower leg: No edema.     Left lower leg: No edema.    Lymphadenopathy:     Cervical: No cervical adenopathy.  Skin:    General: Skin is warm and dry.  Neurological:     General: No focal deficit present.     Mental Status: She is alert and oriented to person, place, and time.  Psychiatric:        Mood and Affect: Mood normal.        Behavior: Behavior normal.        Thought Content: Thought content normal.        Judgment: Judgment normal.     Comments: Agitated about work situation.  Not angry or hostile.       Results for orders placed or performed in visit on 08/20/19  POCT urinalysis dipstick  Result Value Ref Range   Color, UA yellow    Clarity, UA clear    Glucose, UA Negative Negative   Bilirubin, UA Negative    Ketones, UA Negative    Spec Grav, UA 1.010  1.010 - 1.025   Blood, UA Negative    pH, UA 6.0 5.0 - 8.0   Protein, UA Negative Negative   Urobilinogen, UA 0.2 0.2 or 1.0 E.U./dL   Nitrite, UA Negative    Leukocytes, UA Negative Negative    Assessment & Plan     1. Hyperlipidemia, unspecified hyperlipidemia type Check labs on rosuvastatin - Comprehensive metabolic panel - CBC with Differential/Platelet - Lipid Panel With LDL/HDL Ratio - rosuvastatin (CRESTOR) 10 MG tablet; Take 1 tablet (10 mg total) by mouth daily.  Dispense: 30 tablet; Refill: 0  2. Recurrent major depressive disorder, in partial remission ([place]) In partial remission.  Patient may benefit from counseling.  Start sertraline 50 mg daily.  Follow-up 1 to 2 months. - Comprehensive metabolic panel - TSH - VITAMIN D 25 Hydroxy (Vit-D Deficiency, Fractures) - Hemoglobin A1c - sertraline (ZOLOFT) 50 MG tablet; Take 1 tablet (50 mg total) by mouth daily.  Dispense: 90 tablet; Refill: 3  3. Primary insomnia Encourage using less Ambien - TSH  4. Other migraine without status migrainosus, not intractable Clinically stable. - CBC with Differential/Platelet - topiramate (TOPAMAX) 100 MG tablet; Take 1 tablet (100 mg total) by mouth daily.   Dispense: 90 tablet; Refill: 3 - SUMAtriptan Succinate Refill 6 MG/0.5ML SOCT; INJECT 6MG  INTO THE SKIN  ONCE AS DIRECTED  Dispense: 15 mL; Refill: 1  5. Frequent urination Check for UTI.  This is her only symptom.  Complete physical later in the year - POCT urinalysis dipstick  6. Other insomnia  - zolpidem (AMBIEN) 10 MG tablet; Take 1 tablet (10 mg total) by mouth at bedtime as needed. for sleep  Dispense: 90 tablet; Refill: 1   Return in about 2 months (around 10/21/2019) for chronic disease f/u.      I, [place], have reviewed all documentation for this visit. The documentation on 08/21/19 for the exam, diagnosis, procedures, and orders are all accurate and complete.    Yuta Cipollone Cranford Mon, MD  [facility] [phone] (phone) [phone] (fax)  [place]

## 2019-08-14 ENCOUNTER — Other Ambulatory Visit: Payer: Self-pay | Admitting: Family Medicine

## 2019-08-14 MED ORDER — ROSUVASTATIN CALCIUM 10 MG PO TABS: 10.0000 mg | ORAL_TABLET | Freq: Every day | ORAL | 0 refills | Status: DC

## 2019-08-14 NOTE — Telephone Encounter (Signed)
[facility] faxed refill request for the following medications:  rosuvastatin (CRESTOR) 10 MG tablet - (2nd request)  Please advise.  Thanks, [employer]

## 2019-08-20 ENCOUNTER — Ambulatory Visit: Payer: Managed Care, Other (non HMO) | Admitting: Family Medicine

## 2019-08-20 ENCOUNTER — Encounter: Payer: Self-pay | Admitting: Family Medicine

## 2019-08-20 ENCOUNTER — Other Ambulatory Visit: Payer: Self-pay

## 2019-08-20 VITALS — BP 89/61 | HR 103 | Temp 97.3°F | Resp 16 | Ht 63.0 in | Wt 140.2 lb

## 2019-08-20 DIAGNOSIS — E785 Hyperlipidemia, unspecified: Secondary | ICD-10-CM | POA: Diagnosis not present

## 2019-08-20 DIAGNOSIS — F5101 Primary insomnia: Secondary | ICD-10-CM

## 2019-08-20 DIAGNOSIS — R350 Frequency of micturition: Secondary | ICD-10-CM | POA: Diagnosis not present

## 2019-08-20 DIAGNOSIS — G43809 Other migraine, not intractable, without status migrainosus: Secondary | ICD-10-CM | POA: Diagnosis not present

## 2019-08-20 DIAGNOSIS — F3341 Major depressive disorder, recurrent, in partial remission: Secondary | ICD-10-CM | POA: Diagnosis not present

## 2019-08-20 DIAGNOSIS — G4709 Other insomnia: Secondary | ICD-10-CM

## 2019-08-20 MED ORDER — TOPIRAMATE 100 MG PO TABS: 100.0000 mg | ORAL_TABLET | Freq: Every day | ORAL | 3 refills | Status: DC

## 2019-08-20 MED ORDER — SUMATRIPTAN SUCCINATE REFILL 6 MG/0.5ML ~~LOC~~ SOCT: SUBCUTANEOUS | 1 refills | Status: DC

## 2019-08-20 MED ORDER — SERTRALINE HCL 50 MG PO TABS
50.0000 mg | ORAL_TABLET | Freq: Every day | ORAL | 3 refills | Status: DC
Start: 2019-08-20 — End: 2019-09-24

## 2019-08-20 MED ORDER — ROSUVASTATIN CALCIUM 10 MG PO TABS: 10.0000 mg | ORAL_TABLET | Freq: Every day | ORAL | 0 refills | Status: DC

## 2019-08-21 LAB — POCT URINALYSIS DIPSTICK
Bilirubin, UA: NEGATIVE
Blood, UA: NEGATIVE
Glucose, UA: NEGATIVE
Ketones, UA: NEGATIVE
Leukocytes, UA: NEGATIVE
Nitrite, UA: NEGATIVE
Protein, UA: NEGATIVE
Spec Grav, UA: 1.01 (ref 1.010–1.025)
Urobilinogen, UA: 0.2 E.U./dL
pH, UA: 6 (ref 5.0–8.0)

## 2019-08-21 MED ORDER — ZOLPIDEM TARTRATE 10 MG PO TABS: 10.0000 mg | ORAL_TABLET | Freq: Every evening | ORAL | 1 refills | Status: DC | PRN

## 2019-08-28 ENCOUNTER — Other Ambulatory Visit: Payer: Self-pay | Admitting: Family Medicine

## 2019-08-28 DIAGNOSIS — E785 Hyperlipidemia, unspecified: Secondary | ICD-10-CM

## 2019-08-28 NOTE — Telephone Encounter (Signed)
Requested medications are due for refill today?  Yes  Requested medications are on active medication list?  (yes or not)  Last Refill:   08/20/2019  # 30 with no refill.  Notation made that patient must keep upcoming appointment.    Future visit scheduled?  Yes in 3 weeks on 09/24/2019    Notes to Clinic:  Medication failed Rx refill protocol due to no labs within the past 360 days.  Last labs performed in 04/18/2017.  Patient was seen one week ago. There are labs ordered and active, but patient has not had labs drawn yet.

## 2019-08-30 LAB — COMPREHENSIVE METABOLIC PANEL
ALT: 18 IU/L (ref 0–32)
AST: 20 IU/L (ref 0–40)
Albumin/Globulin Ratio: 1.9 (ref 1.2–2.2)
Albumin: 4.6 g/dL (ref 3.8–4.9)
Alkaline Phosphatase: 107 IU/L (ref 48–121)
BUN/Creatinine Ratio: 26 — ABNORMAL HIGH (ref 9–23)
BUN: 21 mg/dL (ref 6–24)
Bilirubin Total: 0.3 mg/dL (ref 0.0–1.2)
CO2: 20 mmol/L (ref 20–29)
Calcium: 9.1 mg/dL (ref 8.7–10.2)
Chloride: 106 mmol/L (ref 96–106)
Creatinine, Ser: 0.82 mg/dL (ref 0.57–1.00)
GFR calc Af Amer: 92 mL/min/{1.73_m2} (ref >59)
GFR calc non Af Amer: 80 mL/min/{1.73_m2} (ref >59)
Globulin, Total: 2.4 g/dL (ref 1.5–4.5)
Glucose: 98 mg/dL (ref 65–99)
Potassium: 4.1 mmol/L (ref 3.5–5.2)
Sodium: 140 mmol/L (ref 134–144)
Total Protein: 7 g/dL (ref 6.0–8.5)

## 2019-08-30 LAB — LIPID PANEL WITH LDL/HDL RATIO
Cholesterol, Total: 183 mg/dL (ref 100–199)
HDL: 48 mg/dL (ref >39)
LDL Chol Calc (NIH): 109 mg/dL — ABNORMAL HIGH (ref 0–99)
LDL/HDL Ratio: 2.3 ratio (ref 0.0–3.2)
Triglycerides: 145 mg/dL (ref 0–149)
VLDL Cholesterol Cal: 26 mg/dL (ref 5–40)

## 2019-08-30 LAB — TSH: TSH: 1.85 u[IU]/mL (ref 0.450–4.500)

## 2019-08-30 LAB — CBC WITH DIFFERENTIAL/PLATELET
Basophils Absolute: 0.1 10*3/uL (ref 0.0–0.2)
Basos: 1 %
EOS (ABSOLUTE): 0.1 10*3/uL (ref 0.0–0.4)
Eos: 2 %
Hematocrit: 38.5 % (ref 34.0–46.6)
Hemoglobin: 13.2 g/dL (ref 11.1–15.9)
Immature Grans (Abs): 0 10*3/uL (ref 0.0–0.1)
Immature Granulocytes: 0 %
Lymphocytes Absolute: 2.3 10*3/uL (ref 0.7–3.1)
Lymphs: 36 %
MCH: 31.4 pg (ref 26.6–33.0)
MCHC: 34.3 g/dL (ref 31.5–35.7)
MCV: 91 fL (ref 79–97)
Monocytes Absolute: 0.5 10*3/uL (ref 0.1–0.9)
Monocytes: 8 %
Neutrophils Absolute: 3.3 10*3/uL (ref 1.4–7.0)
Neutrophils: 53 %
Platelets: 270 10*3/uL (ref 150–450)
RBC: 4.21 x10E6/uL (ref 3.77–5.28)
RDW: 12.4 % (ref 11.7–15.4)
WBC: 6.3 10*3/uL (ref 3.4–10.8)

## 2019-08-30 LAB — HEMOGLOBIN A1C
Est. average glucose Bld gHb Est-mCnc: 108 mg/dL
Hgb A1c MFr Bld: 5.4 % (ref 4.8–5.6)

## 2019-08-30 LAB — VITAMIN D 25 HYDROXY (VIT D DEFICIENCY, FRACTURES): Vit D, 25-Hydroxy: 50.5 ng/mL (ref 30.0–100.0)

## 2019-09-19 NOTE — Progress Notes (Signed)
I,April Miller,acting as a scribe for Wilhemena Durie, MD.,have documented all relevant documentation on the behalf of Wilhemena Durie, MD,as directed by  Wilhemena Durie, MD while in the presence of Wilhemena Durie, MD.   Established patient visit   Patient: Tammy Edwards   DOB: Nov 12, 1960   59 y.o. Female  MRN: [id number] Visit Date: 09/24/2019  Today's healthcare provider: Wilhemena Durie, MD   Chief Complaint  Patient presents with  . Depression  . Follow-up   Subjective    HPI  Patient states she is 50% better since her last visit.  She is very encouraged by this. Depression, Follow-up  She  was last seen for this 1 months ago. Changes made at last visit include; In partial remission.  Patient may benefit from counseling.  Start sertraline 50 mg daily.  Follow-up 1 to 2 months.   She reports good compliance with treatment. She is not having side effects. non  She reports good tolerance of treatment. Current symptoms include: n/a She feels she is Improved since last visit.  Depression screen [facility] 2/9 09/24/2019 08/20/2019 09/08/2016  Decreased Interest 1 3 1   Down, Depressed, Hopeless 1 2 0  PHQ - 2 Score 2 5 1   Altered sleeping 1 2 2   Tired, decreased energy 1 2 2   Change in appetite 1 3 0  Feeling bad or failure about yourself  0 0 0  Trouble concentrating 0 0 1  Moving slowly or fidgety/restless 0 0 0  Suicidal thoughts 0 0 0  PHQ-9 Score 5 12 6   Difficult doing work/chores Somewhat difficult Somewhat difficult -    -------------------------------------------------------------------- Patient states she is feeling better since starting sertraline.      Medications: Outpatient Medications Prior to Visit  Medication Sig  . cholecalciferol (VITAMIN D) 1000 UNITS tablet Take by mouth. Reported on 03/10/2015  . rosuvastatin (CRESTOR) 10 MG tablet Take 1 tablet (10 mg total) by mouth daily.  . SUMAtriptan (IMITREX) 100 MG tablet Take 1 tablet (100  mg total) by mouth every 2 (two) hours as needed for migraine. May repeat in 2 hours if headache persists or recurs.  . SUMAtriptan Succinate Refill 6 MG/0.5ML SOCT INJECT 6MG  INTO THE SKIN  ONCE AS DIRECTED  . topiramate (TOPAMAX) 100 MG tablet Take 1 tablet (100 mg total) by mouth daily.  Marland Kitchen zolpidem (AMBIEN) 10 MG tablet Take 1 tablet (10 mg total) by mouth at bedtime as needed. for sleep  . [DISCONTINUED] sertraline (ZOLOFT) 50 MG tablet Take 1 tablet (50 mg total) by mouth daily.   No facility-administered medications prior to visit.    Review of Systems  Constitutional: Negative for appetite change, chills, fatigue and fever.  Respiratory: Negative for chest tightness and shortness of breath.   Cardiovascular: Negative for chest pain and palpitations.  Gastrointestinal: Negative for abdominal pain, nausea and vomiting.  Neurological: Negative for dizziness and weakness.       Objective    BP 101/67 (BP Location: Left Arm, Patient Position: Sitting, Cuff Size: Large)   Pulse 88   Temp 98 F (36.7 C) (Other (Comment))   Resp 16   Ht 5\' 3"  (1.6 m)   Wt 142 lb (64.4 kg)   SpO2 98%   BMI 25.15 kg/m  Wt Readings from Last 3 Encounters:  09/24/19 142 lb (64.4 kg)  08/20/19 140 lb 3.2 oz (63.6 kg)  10/19/17 162 lb (73.5 kg)      Physical Exam Vitals reviewed.  Constitutional:      Appearance: She is well-developed.  HENT:     Head: Normocephalic and atraumatic.     Right Ear: External ear normal.     Left Ear: External ear normal.  Eyes:     Conjunctiva/sclera: Conjunctivae normal.     Pupils: Pupils are equal, round, and reactive to light.  Cardiovascular:     Rate and Rhythm: Normal rate and regular rhythm.     Heart sounds: Normal heart sounds.  Pulmonary:     Effort: Pulmonary effort is normal.     Breath sounds: Normal breath sounds.  Abdominal:     General: Bowel sounds are normal.     Palpations: Abdomen is soft.     Comments: No CVAT.  Musculoskeletal:         General: Normal range of motion.     Cervical back: Neck supple.  Skin:    General: Skin is warm and dry.  Neurological:     Mental Status: She is alert and oriented to person, place, and time.     Deep Tendon Reflexes: Reflexes are normal and symmetric.  Psychiatric:        Behavior: Behavior normal.        Thought Content: Thought content normal.        Judgment: Judgment normal.     BP 101/67 (BP Location: Left Arm, Patient Position: Sitting, Cuff Size: Large)   Pulse 88   Temp 98 F (36.7 C) (Other (Comment))   Resp 16   Ht 5\' 3"  (1.6 m)   Wt 142 lb (64.4 kg)   SpO2 98%   BMI 25.15 kg/m   General Appearance:    Alert, cooperative, no distress, appears stated age  Head:    Normocephalic, without obvious abnormality, atraumatic  Eyes:    PERRL, conjunctiva/corneas clear, EOM's intact, fundi    benign, both eyes  Ears:    Normal TM's and external ear canals, both ears  Nose:   Nares normal, septum midline, mucosa normal, no drainage    or sinus tenderness  Throat:   Lips, mucosa, and tongue normal; teeth and gums normal  Neck:   Supple, symmetrical, trachea midline, no adenopathy;    thyroid:  no enlargement/tenderness/nodules; no carotid   bruit or JVD  Back:     Symmetric, no curvature, ROM normal, no CVA tenderness  Lungs:     Clear to auscultation bilaterally, respirations unlabored  Chest Wall:    No tenderness or deformity   Heart:    Regular rate and rhythm, S1 and S2 normal, no murmur, rub   or gallop  Breast Exam:    No tenderness, masses, or nipple abnormality  Abdomen:     Soft, non-tender, bowel sounds active all four quadrants,    no masses, no organomegaly  Genitalia:    Normal female without lesion, discharge or tenderness  Rectal:    Normal tone, normal prostate, no masses or tenderness;   guaiac negative stool  Extremities:   Extremities normal, atraumatic, no cyanosis or edema  Pulses:   2+ and symmetric all extremities  Skin:   Skin color,  texture, turgor normal, no rashes or lesions  Lymph nodes:   Cervical, supraclavicular, and axillary nodes normal  Neurologic:   CNII-XII intact, normal strength, sensation and reflexes    throughout     No results found for any visits on 09/24/19.  Assessment & Plan     1. Recurrent major depressive disorder, in partial remission (  HCC) Increased sertraline from 50 mg to 100 mg qd.  Follow-up 2 to 3 months with improvement. - sertraline (ZOLOFT) 100 MG tablet; Take 1 tablet (100 mg total) by mouth daily.  Dispense: 90 tablet; Refill: 3   Return in 3 months (on 12/25/2019).         Caryn Gienger Cranford Mon, MD  [facility] [phone] (phone) [phone] (fax)  [place]

## 2019-09-24 ENCOUNTER — Ambulatory Visit: Payer: Managed Care, Other (non HMO) | Admitting: Family Medicine

## 2019-09-24 ENCOUNTER — Encounter: Payer: Self-pay | Admitting: Family Medicine

## 2019-09-24 ENCOUNTER — Other Ambulatory Visit: Payer: Self-pay

## 2019-09-24 VITALS — BP 101/67 | HR 88 | Temp 98.0°F | Resp 16 | Ht 63.0 in | Wt 142.0 lb

## 2019-09-24 DIAGNOSIS — F3341 Major depressive disorder, recurrent, in partial remission: Secondary | ICD-10-CM

## 2019-09-24 MED ORDER — SERTRALINE HCL 100 MG PO TABS: 100.0000 mg | ORAL_TABLET | Freq: Every day | ORAL | 3 refills | Status: DC

## 2019-10-17 ENCOUNTER — Other Ambulatory Visit: Payer: Self-pay | Admitting: Family Medicine

## 2019-10-17 DIAGNOSIS — G43809 Other migraine, not intractable, without status migrainosus: Secondary | ICD-10-CM

## 2019-10-22 ENCOUNTER — Ambulatory Visit: Payer: Self-pay | Admitting: Family Medicine

## 2019-12-24 NOTE — Progress Notes (Deleted)
     Established patient visit   Patient: Tammy Edwards   DOB: 06/08/1960   59 y.o. Female  MRN: [id number] Visit Date: 12/25/2019  Today's healthcare provider: Wilhemena Durie, MD   No chief complaint on file.  Subjective    HPI  Depression, Follow-up  She  was last seen for this 3 months ago. Changes made at last visit include; Increased sertraline from 50 mg to 100 mg qd.    She reports {excellent/good/fair/poor:19665} compliance with treatment. She {is/is not:21021397} having side effects. ***  She reports {DESC; GOOD/FAIR/POOR:18685} tolerance of treatment. Current symptoms include: {Symptoms; depression:1002} She feels she is {improved/worse/unchanged:3041574} since last visit.  Depression screen [facility] 2/9 09/24/2019 08/20/2019 09/08/2016  Decreased Interest 1 3 1   Down, Depressed, Hopeless 1 2 0  PHQ - 2 Score 2 5 1   Altered sleeping 1 2 2   Tired, decreased energy 1 2 2   Change in appetite 1 3 0  Feeling bad or failure about yourself  0 0 0  Trouble concentrating 0 0 1  Moving slowly or fidgety/restless 0 0 0  Suicidal thoughts 0 0 0  PHQ-9 Score 5 12 6   Difficult doing work/chores Somewhat difficult Somewhat difficult -    -----------------------------------------------------------------------------------------   {Show patient history (optional):23778::" "}   Medications: Outpatient Medications Prior to Visit  Medication Sig  . cholecalciferol (VITAMIN D) 1000 UNITS tablet Take by mouth. Reported on 03/10/2015  . rosuvastatin (CRESTOR) 10 MG tablet Take 1 tablet (10 mg total) by mouth daily.  . sertraline (ZOLOFT) 100 MG tablet Take 1 tablet (100 mg total) by mouth daily.  . SUMAtriptan (IMITREX) 100 MG tablet TAKE 1 TABLET BY MOUTH  DAILY AS NEEDED  . SUMAtriptan Succinate Refill 6 MG/0.5ML SOCT INJECT 6MG  INTO THE SKIN  ONCE AS DIRECTED  . topiramate (TOPAMAX) 100 MG tablet Take 1 tablet (100 mg total) by mouth daily.  Marland Kitchen zolpidem (AMBIEN) 10 MG tablet Take 1  tablet (10 mg total) by mouth at bedtime as needed. for sleep   No facility-administered medications prior to visit.    Review of Systems  Constitutional: Negative for appetite change, chills, fatigue and fever.  Respiratory: Negative for chest tightness and shortness of breath.   Cardiovascular: Negative for chest pain and palpitations.  Gastrointestinal: Negative for abdominal pain, nausea and vomiting.  Neurological: Negative for dizziness and weakness.    {Heme  Chem  Endocrine  Serology  Results Review (optional):23779::" "}  Objective    There were no vitals taken for this visit. {Show previous vital signs (optional):23777::" "}  Physical Exam  ***  No results found for any visits on 12/25/19.  Assessment & Plan     ***  No follow-ups on file.      {provider attestation***:1}   Wilhemena Durie, MD  [facility] [phone] (phone) [phone] (fax)  [place]

## 2019-12-25 ENCOUNTER — Ambulatory Visit: Payer: Self-pay | Admitting: Family Medicine

## 2020-03-05 ENCOUNTER — Other Ambulatory Visit: Payer: Self-pay | Admitting: Family Medicine

## 2020-03-05 DIAGNOSIS — G4709 Other insomnia: Secondary | ICD-10-CM

## 2020-03-05 DIAGNOSIS — E785 Hyperlipidemia, unspecified: Secondary | ICD-10-CM

## 2020-03-05 NOTE — Telephone Encounter (Signed)
Requested medication (s) are due for refill today: Yes  Requested medication (s) are on the active medication list: Yes  Last refill:  08/20/19  Future visit scheduled: No  Notes to clinic:  Unable to refill per protocol, cannot delegate.      Requested Prescriptions  Pending Prescriptions Disp Refills   zolpidem (AMBIEN) 10 MG tablet [Pharmacy Med Name: Zolpidem Tartrate 10 MG Oral Tablet] 90 tablet     Sig: TAKE 1 TABLET BY MOUTH AT  BEDTIME AS NEEDED FOR SLEEP      Not Delegated - Psychiatry:  Anxiolytics/Hypnotics Failed - 03/05/2020 12:28 PM      Failed - This refill cannot be delegated      Failed - Urine Drug Screen completed in last 360 days      Passed - Valid encounter within last 6 months    Recent Outpatient Visits           5 months ago Recurrent major depressive disorder, in partial remission ([place])   [facility] Jerrol Banana., MD   6 months ago Hyperlipidemia, unspecified hyperlipidemia type   [facility] Jerrol Banana., MD   2 years ago Need for influenza vaccination   [facility] Jerrol Banana., MD   2 years ago Hyperlipidemia, unspecified hyperlipidemia type   [facility] Jerrol Banana., MD   2 years ago Hyperlipidemia, unspecified hyperlipidemia type   [facility] Jerrol Banana., MD                 Signed Prescriptions Disp Refills   rosuvastatin (CRESTOR) 10 MG tablet 90 tablet 1    Sig: TAKE 1 TABLET BY MOUTH  DAILY      Cardiovascular:  Antilipid - Statins Failed - 03/05/2020 12:28 PM      Failed - LDL in normal range and within 360 days    LDL Chol Calc (NIH)  Date Value Ref Range Status  08/29/2019 109 (H) 0 - 99 mg/dL Final          Passed - Total Cholesterol in normal range and within 360 days    Cholesterol, Total  Date Value Ref Range Status  08/29/2019 183 100 - 199 mg/dL Final          Passed - HDL in normal  range and within 360 days    HDL  Date Value Ref Range Status  08/29/2019 48 >39 mg/dL Final          Passed - Triglycerides in normal range and within 360 days    Triglycerides  Date Value Ref Range Status  08/29/2019 145 0 - 149 mg/dL Final          Passed - Patient is not pregnant      Passed - Valid encounter within last 12 months    Recent Outpatient Visits           5 months ago Recurrent major depressive disorder, in partial remission [facility])   [facility] Jerrol Banana., MD   6 months ago Hyperlipidemia, unspecified hyperlipidemia type   [facility] Jerrol Banana., MD   2 years ago Need for influenza vaccination   [facility] Jerrol Banana., MD   2 years ago Hyperlipidemia, unspecified hyperlipidemia type   [facility]   2 years ago Hyperlipidemia, unspecified hyperlipidemia type   [place],  Retia Passe., MD                  naproxen (NAPROSYN) 500 MG tablet 180 tablet 1    Sig: TAKE 1 TABLET BY MOUTH  TWICE DAILY AS NEEDED FOR  PAIN      Analgesics:  NSAIDS Passed - 03/05/2020 12:28 PM      Passed - Cr in normal range and within 360 days    Creatinine, Ser  Date Value Ref Range Status  08/29/2019 0.82 0.57 - 1.00 mg/dL Final          Passed - HGB in normal range and within 360 days    Hemoglobin  Date Value Ref Range Status  08/29/2019 13.2 11.1 - 15.9 g/dL Final          Passed - Patient is not pregnant      Passed - Valid encounter within last 12 months    Recent Outpatient Visits           5 months ago Recurrent major depressive disorder, in partial remission [facility])   [facility] Jerrol Banana., MD   6 months ago Hyperlipidemia, unspecified hyperlipidemia type   [facility] Jerrol Banana., MD   2 years ago Need for influenza vaccination   [facility] Jerrol Banana., MD   2 years ago Hyperlipidemia, unspecified hyperlipidemia type   [facility] Jerrol Banana., MD   2 years ago Hyperlipidemia, unspecified hyperlipidemia type   [facility] Jerrol Banana., MD

## 2020-06-16 ENCOUNTER — Other Ambulatory Visit: Payer: Self-pay | Admitting: Family Medicine

## 2020-06-16 DIAGNOSIS — G43809 Other migraine, not intractable, without status migrainosus: Secondary | ICD-10-CM

## 2020-06-16 DIAGNOSIS — G4709 Other insomnia: Secondary | ICD-10-CM

## 2020-06-16 DIAGNOSIS — F3341 Major depressive disorder, recurrent, in partial remission: Secondary | ICD-10-CM

## 2020-06-16 NOTE — Telephone Encounter (Signed)
Requested medications are due for refill today Zoloft no, other two rx yes  Requested medications are on the active medication list yes  Last refill Zoloft just filled 05/29/20, other two filled 03/05/20  Last visit 09/24/19  Future visit scheduled no  Notes to clinic Last OV note in Aug states to return in three months to assess if increase in Zoloft is working, no upcoming visit scheduled. Ambien is Not Delegated, Sumatriptan does not have a protocol, please assess.

## 2020-07-23 ENCOUNTER — Other Ambulatory Visit: Payer: Self-pay | Admitting: Family Medicine

## 2020-07-23 DIAGNOSIS — G43809 Other migraine, not intractable, without status migrainosus: Secondary | ICD-10-CM

## 2020-07-23 NOTE — Telephone Encounter (Signed)
Requested medications are due for refill today.  yes  Requested medications are on the active medications list.  yes  Last refill. 08/20/2019  Future visit scheduled.   no  Notes to clinic.  Medication not delegated.

## 2020-08-10 ENCOUNTER — Other Ambulatory Visit: Payer: Self-pay | Admitting: Family Medicine

## 2020-08-10 DIAGNOSIS — E785 Hyperlipidemia, unspecified: Secondary | ICD-10-CM

## 2020-08-12 NOTE — Telephone Encounter (Signed)
Notes to clinic:  Patient has canceled and no showed last 2 appts  Review for refills   Requested Prescriptions  Pending Prescriptions Disp Refills   naproxen (NAPROSYN) 500 MG tablet [Pharmacy Med Name: Naproxen 500 MG Oral Tablet] 180 tablet 3    Sig: TAKE 1 TABLET BY MOUTH  TWICE DAILY AS NEEDED FOR  PAIN      Analgesics:  NSAIDS Passed - 08/10/2020  9:18 PM      Passed - Cr in normal range and within 360 days    Creatinine, Ser  Date Value Ref Range Status  08/29/2019 0.82 0.57 - 1.00 mg/dL Final          Passed - HGB in normal range and within 360 days    Hemoglobin  Date Value Ref Range Status  08/29/2019 13.2 11.1 - 15.9 g/dL Final          Passed - Patient is not pregnant      Passed - Valid encounter within last 12 months    Recent Outpatient Visits           10 months ago Recurrent major depressive disorder, in partial remission ([place])   [facility] Jerrol Banana., MD   11 months ago Hyperlipidemia, unspecified hyperlipidemia type   [facility] Jerrol Banana., MD   2 years ago Need for influenza vaccination   [facility] Jerrol Banana., MD   3 years ago Hyperlipidemia, unspecified hyperlipidemia type   [facility] Jerrol Banana., MD   3 years ago Hyperlipidemia, unspecified hyperlipidemia type   [facility] Jerrol Banana., MD                  rosuvastatin (CRESTOR) 10 MG tablet [Pharmacy Med Name: Rosuvastatin Calcium 10 MG Oral Tablet] 90 tablet 3    Sig: TAKE 1 TABLET BY MOUTH  DAILY      Cardiovascular:  Antilipid - Statins Failed - 08/10/2020  9:18 PM      Failed - LDL in normal range and within 360 days    LDL Chol Calc (NIH)  Date Value Ref Range Status  08/29/2019 109 (H) 0 - 99 mg/dL Final          Passed - Total Cholesterol in normal range and within 360 days    Cholesterol, Total  Date Value Ref Range Status  08/29/2019  183 100 - 199 mg/dL Final          Passed - HDL in normal range and within 360 days    HDL  Date Value Ref Range Status  08/29/2019 48 >39 mg/dL Final          Passed - Triglycerides in normal range and within 360 days    Triglycerides  Date Value Ref Range Status  08/29/2019 145 0 - 149 mg/dL Final          Passed - Patient is not pregnant      Passed - Valid encounter within last 12 months    Recent Outpatient Visits           10 months ago Recurrent major depressive disorder, in partial remission [facility])   [facility] Jerrol Banana., MD   11 months ago Hyperlipidemia, unspecified hyperlipidemia type   [facility] Jerrol Banana., MD   2 years ago Need for influenza vaccination   [facility] Jerrol Banana., MD  3 years ago Hyperlipidemia, unspecified hyperlipidemia type   [facility] Jerrol Banana., MD   3 years ago Hyperlipidemia, unspecified hyperlipidemia type   [facility] Jerrol Banana., MD

## 2021-04-06 ENCOUNTER — Other Ambulatory Visit: Payer: Self-pay | Admitting: Family Medicine

## 2021-04-06 DIAGNOSIS — G43809 Other migraine, not intractable, without status migrainosus: Secondary | ICD-10-CM

## 2021-04-06 DIAGNOSIS — G4709 Other insomnia: Secondary | ICD-10-CM

## 2021-04-07 NOTE — Telephone Encounter (Signed)
Requested medications are due for refill today.  yes  Requested medications are on the active medications list.  yes  Last refill. Sumatriptan 10/17/2019 #27 3 refills, Ambien 06/17/2020   Future visit scheduled.   no  Notes to clinic.  Pt last seen 09/24/2019 - more than 3 months overdue for OV.  Ambien not delegated.    Requested Prescriptions  Pending Prescriptions Disp Refills   SUMAtriptan (IMITREX) 100 MG tablet [Pharmacy Med Name: SUMATRIPTAN  100MG   TAB] 27 tablet 3    Sig: TAKE 1 TABLET BY MOUTH  DAILY AS NEEDED     Neurology:  Migraine Therapy - Triptan Failed - 04/06/2021 10:22 AM      Failed - Valid encounter within last 12 months    Recent Outpatient Visits           1 year ago Recurrent major depressive disorder, in partial remission [facility])   [facility] Jerrol Banana., MD   1 year ago Hyperlipidemia, unspecified hyperlipidemia type   [facility] Jerrol Banana., MD   3 years ago Need for influenza vaccination   [facility] Jerrol Banana., MD   3 years ago Hyperlipidemia, unspecified hyperlipidemia type   [facility] Jerrol Banana., MD   4 years ago Hyperlipidemia, unspecified hyperlipidemia type   [facility] Jerrol Banana., MD              Passed - Last BP in normal range    BP Readings from Last 1 Encounters:  09/24/19 101/67           zolpidem (AMBIEN) 10 MG tablet [Pharmacy Med Name: Zolpidem Tartrate 10 MG Oral Tablet] 90 tablet     Sig: TAKE 1 TABLET BY MOUTH AT  BEDTIME AS NEEDED FOR SLEEP     Not Delegated - Psychiatry:  Anxiolytics/Hypnotics Failed - 04/06/2021 10:22 AM      Failed - This refill cannot be delegated      Failed - Urine Drug Screen completed in last 360 days      Failed - Valid encounter within last 6 months    Recent Outpatient Visits           1 year ago Recurrent major depressive disorder, in partial remission  [facility])   [facility] Jerrol Banana., MD   1 year ago Hyperlipidemia, unspecified hyperlipidemia type   [facility]   3 years ago Need for influenza vaccination   [facility] Jerrol Banana., MD   3 years ago Hyperlipidemia, unspecified hyperlipidemia type   [facility] Jerrol Banana., MD   4 years ago Hyperlipidemia, unspecified hyperlipidemia type   [facility] Jerrol Banana., MD

## 2021-06-28 ENCOUNTER — Other Ambulatory Visit: Payer: Self-pay | Admitting: Family Medicine

## 2021-06-28 DIAGNOSIS — G43809 Other migraine, not intractable, without status migrainosus: Secondary | ICD-10-CM

## 2021-07-10 IMAGING — MG MM DIGITAL SCREENING BILAT W/ TOMO W/ CAD
6 of 10 series · 6 of 30 positions shown · non-contrast
Comparison: Previous exam(s).

CLINICAL DATA: Screening.

EXAM:
DIGITAL SCREENING BILATERAL MAMMOGRAM WITH TOMO AND CAD

[L CC synth-2D]
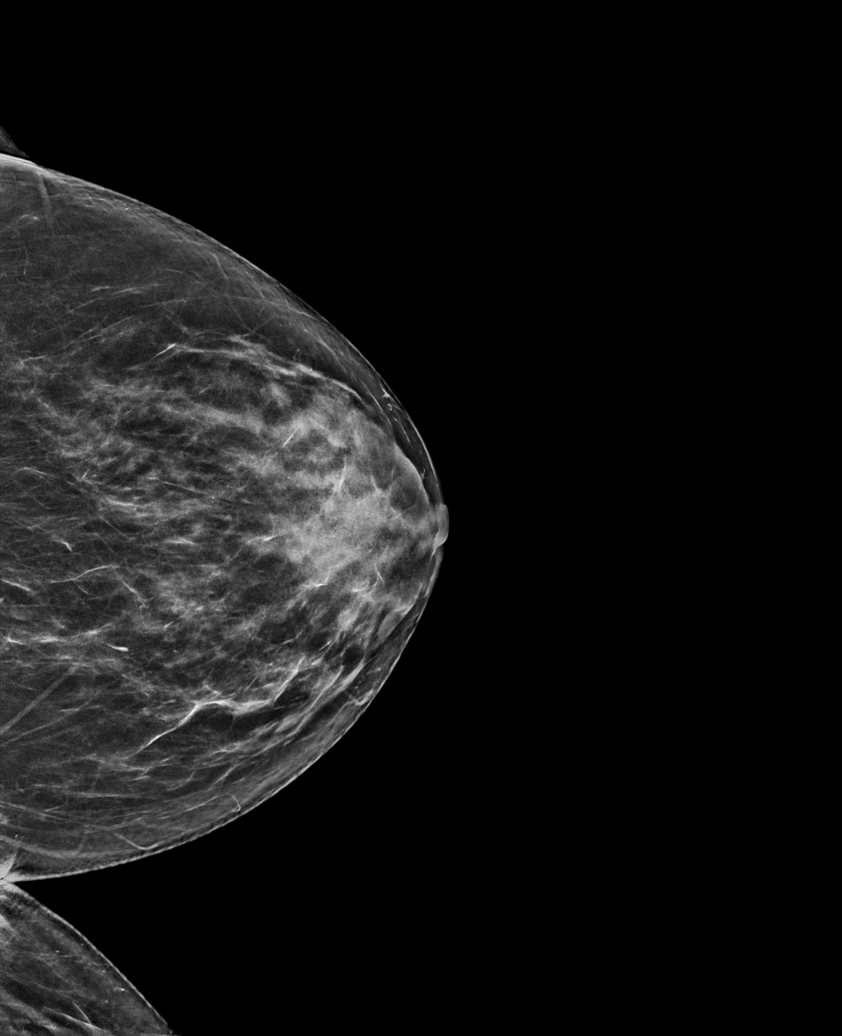

[L MLO synth-2D]
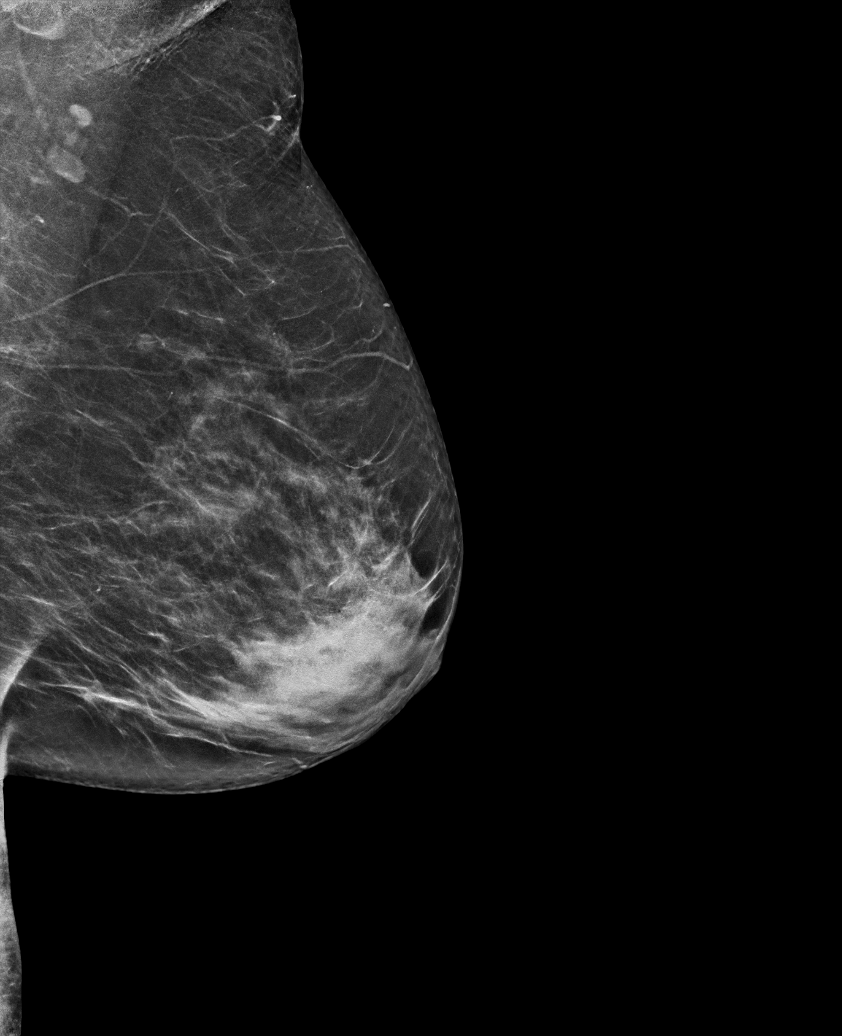

[R MLO synth-2D (1 of 2)]
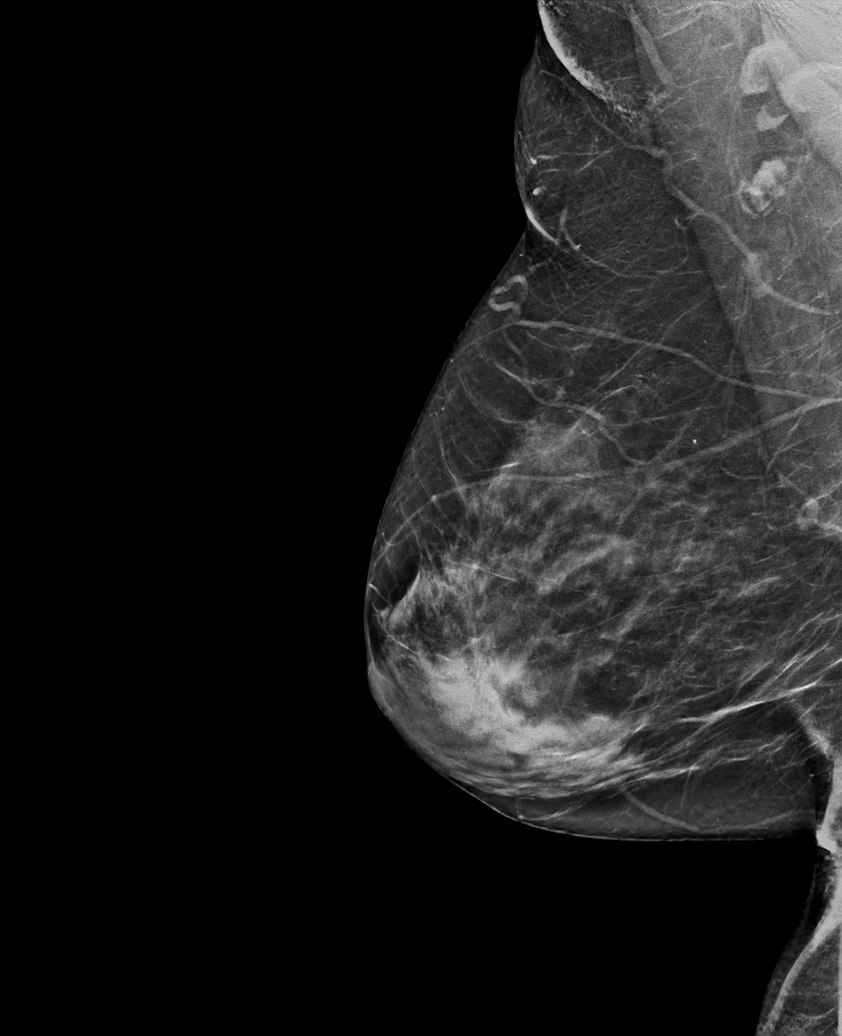

[R MLO synth-2D (2 of 2)]
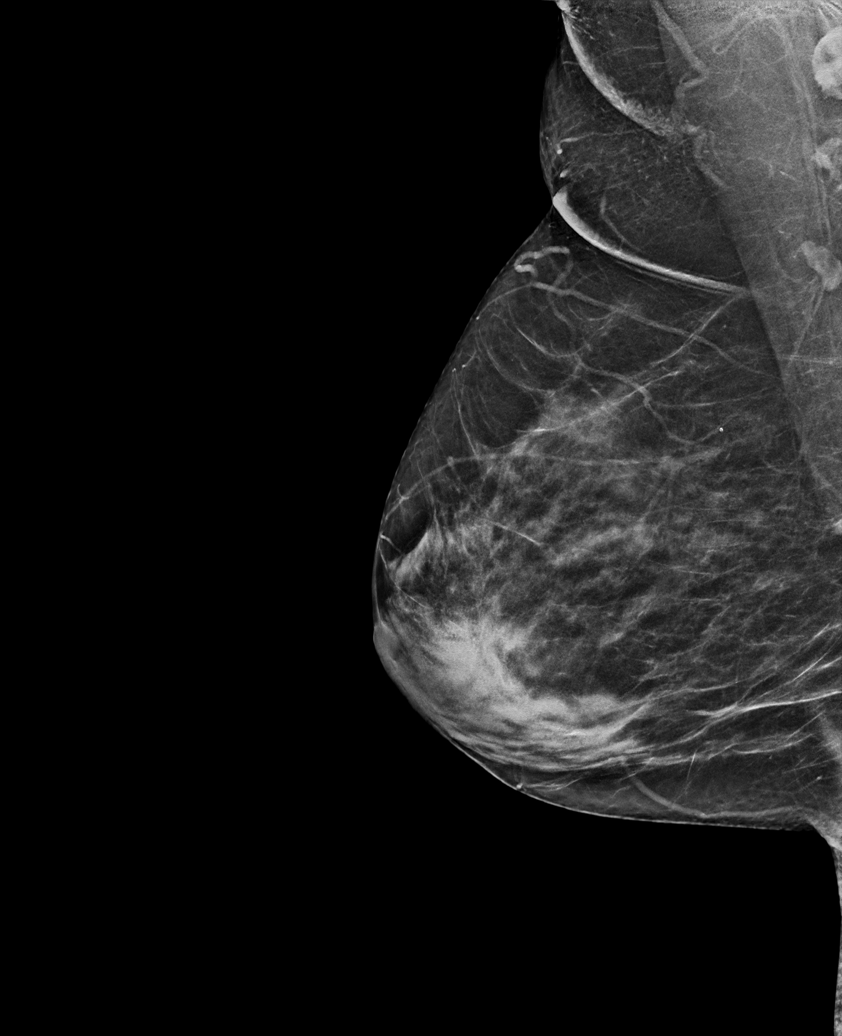

[R CC synth-2D]
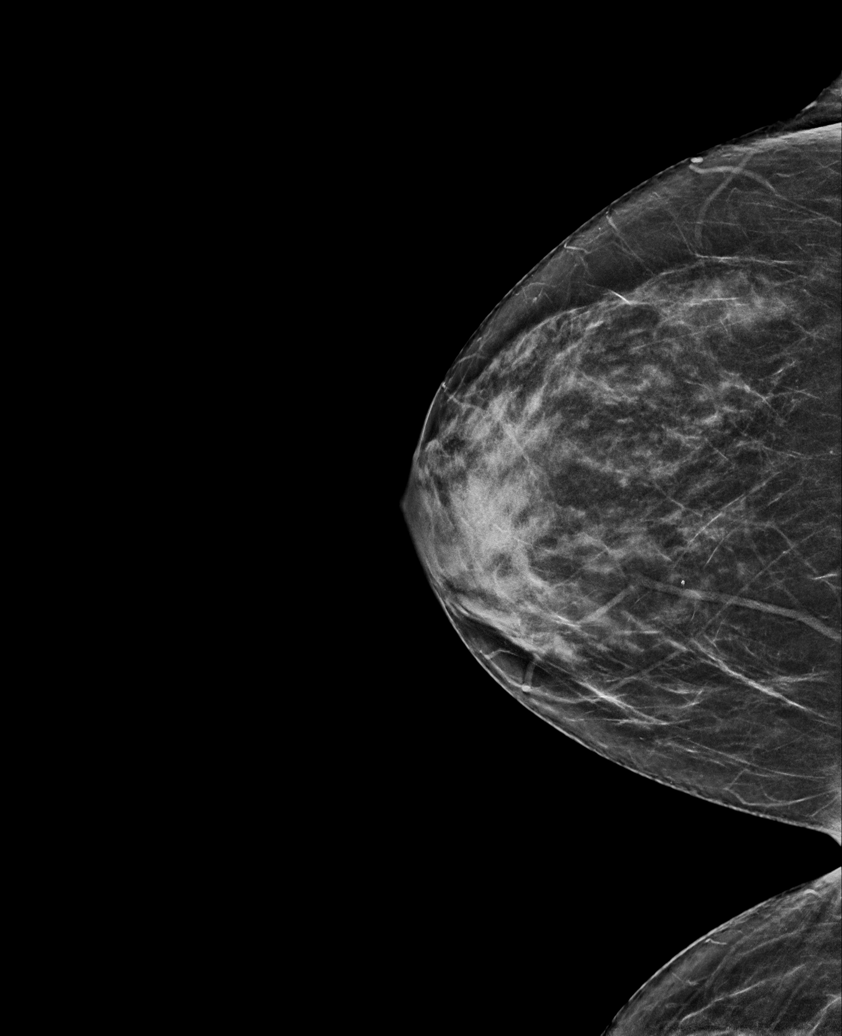

[R CC tomo · tomo slice 28/55.0]
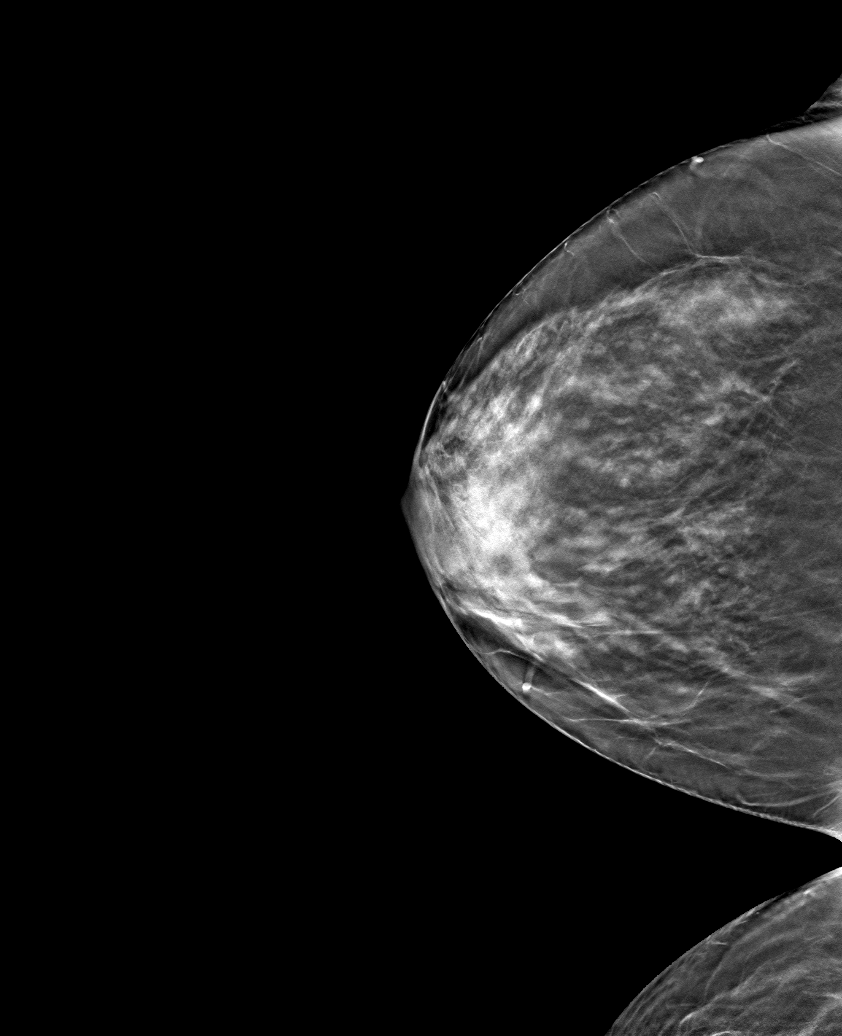

[6 of 30 positions shown; findings below may reference images not displayed]

ACR Breast Density Category c: The breast tissue is heterogeneously
dense, which may obscure small masses.
FINDINGS: There are no findings suspicious for malignancy. Images were
processed with CAD.
IMPRESSION: No mammographic evidence of malignancy. A result letter of this
screening mammogram will be mailed directly to the patient.

RECOMMENDATION:
Screening mammogram in one year. (Code:[id number])

BI-RADS CATEGORY  1: Negative.

## 2021-07-15 ENCOUNTER — Other Ambulatory Visit: Payer: Self-pay | Admitting: Family Medicine

## 2021-07-15 DIAGNOSIS — E785 Hyperlipidemia, unspecified: Secondary | ICD-10-CM

## 2021-07-17 ENCOUNTER — Other Ambulatory Visit: Payer: Self-pay | Admitting: Family Medicine

## 2021-07-17 DIAGNOSIS — E785 Hyperlipidemia, unspecified: Secondary | ICD-10-CM

## 2021-07-20 NOTE — Telephone Encounter (Signed)
Called pt and LM on VM to call office to make appointment for f/u and medication refills.

## 2021-09-15 ENCOUNTER — Other Ambulatory Visit: Payer: Self-pay | Admitting: Family Medicine

## 2021-09-15 DIAGNOSIS — G43809 Other migraine, not intractable, without status migrainosus: Secondary | ICD-10-CM

## 2021-10-05 ENCOUNTER — Other Ambulatory Visit: Payer: Self-pay | Admitting: Family Medicine

## 2021-10-05 DIAGNOSIS — G4709 Other insomnia: Secondary | ICD-10-CM

## 2021-10-13 ENCOUNTER — Ambulatory Visit: Payer: Self-pay | Admitting: *Deleted

## 2021-10-13 NOTE — Telephone Encounter (Signed)
Summary: cold and flu like symptoms / rx req   The patient has tested positive for COVID 19 via an at home test today 10/13/21   The patient is currently experiencing an elevated temperature, sore throat, congestion, headache and body ache   The patient would like to be prescribed something for their discomfort   Please contact further when possible      Chief Complaint: COVID + Symptoms: headache, body aches,  Frequency: constant Pertinent Negatives: Patient denies SOB Disposition: '[]'$ ED /'[]'$ Urgent Care (no appt availability in office) / '[x]'$ Appointment(In office/virtual)/ '[]'$  [facility]/ '[]'$ Home Care/ '[]'$ Refused Recommended Disposition /'[]'$  Mobile Bus/ '[]'$  Follow-up with PCP Additional Notes: Pt has virtual appt scheduled for tomorrow morning with Dr. Rosanna Randy. Pt requesting if Dr. Rosanna Randy could call in an antiviral med rx today to [place] So she can start on it tonight.    Reason for Disposition  [1] HIGH RISK patient AND [2] influenza exposure within the last 7 days AND [3] ONE OR MORE respiratory symptoms: cough, sore throat, runny or stuffy nose  Answer Assessment - Initial Assessment Questions 1. COVID-19 DIAGNOSIS: "How do you know that you have COVID?" (e.g., positive lab test or self-test, diagnosed by doctor or NP/PA, symptoms after exposure).     Home test and [facility] 2. COVID-19 EXPOSURE: "Was there any known exposure to COVID before the symptoms began?" CDC Definition of close contact: within 6 feet (2 meters) for a total of 15 minutes or more over a 24-hour period.      Yes at family reunion 3. ONSET: "When did the COVID-19 symptoms start?"      yesterday 4. WORST SYMPTOM: "What is your worst symptom?" (e.g., cough, fever, shortness of breath, muscle aches)     Aches, headache 5. COUGH: "Do you have a cough?" If Yes, ask: "How bad is the cough?"       little 6. FEVER: "Do you have a fever?" If Yes, ask: "What is your temperature, how was  it measured, and when did it start?"     Feels feverish, went away with advil 7. RESPIRATORY STATUS: "Describe your breathing?" (e.g., normal; shortness of breath, wheezing, unable to speak)      No SOB 8. BETTER-SAME-WORSE: "Are you getting better, staying the same or getting worse compared to yesterday?"  If getting worse, ask, "In what way?"     Just started 9. OTHER SYMPTOMS: "Do you have any other symptoms?"  (e.g., chills, fatigue, headache, loss of smell or taste, muscle pain, sore throat)     No, just fever body aches, headache 10. HIGH RISK DISEASE: "Do you have any chronic medical problems?" (e.g., asthma, heart or lung disease, weak immune system, obesity, etc.)       no 11. VACCINE: "Have you had the COVID-19 vaccine?" If Yes, ask: "Which one, how many shots, when did you get it?"       Yes and booster 12. PREGNANCY: "Is there any chance you are pregnant?" "When was your last menstrual period?"       no 13. O2 SATURATION MONITOR:  "Do you use an oxygen saturation monitor (pulse oximeter) at home?" If Yes, ask "What is your reading (oxygen level) today?" "What is your usual oxygen saturation reading?" (e.g., 95%)       no  Protocols used: Coronavirus (COVID-19) Diagnosed or Suspected-A-AH

## 2021-10-14 ENCOUNTER — Encounter: Payer: Self-pay | Admitting: Family Medicine

## 2021-10-14 ENCOUNTER — Telehealth: Payer: Managed Care, Other (non HMO) | Admitting: Family Medicine

## 2021-10-14 DIAGNOSIS — U071 COVID-19: Secondary | ICD-10-CM | POA: Diagnosis not present

## 2021-10-14 NOTE — Progress Notes (Signed)
MyChart Video Visit    Virtual Visit via Video Note   This visit type was conducted due to national recommendations for restrictions regarding the COVID-19 Pandemic (e.g. social distancing) in an effort to limit this patient's exposure and mitigate transmission in our community. This patient is at least at moderate risk for complications without adequate follow up. This format is felt to be most appropriate for this patient at this time. Physical exam was limited by quality of the video and audio technology used for the visit.  We were unable to connect via the computer.  This is a telephone visit. Patient location: home Provider location: office  I discussed the limitations of evaluation and management by telemedicine and the availability of in person appointments. The patient expressed understanding and agreed to proceed.  Patient: Tammy Edwards   DOB: 1960/06/22   61 y.o. Female  MRN: [id number] Visit Date: 10/14/2021  Today's healthcare provider: Wilhemena Durie, MD   No chief complaint on file.  Subjective    HPI  Patient had a big family reunion and now has COVID for the past couple of days with low-grade fever, body aches, nasal congestion and sore throat.  No chest pain cough or shortness of breath.  She is wondering about treatment.   Medications: Outpatient Medications Prior to Visit  Medication Sig   naproxen (NAPROSYN) 500 MG tablet TAKE 1 TABLET BY MOUTH  TWICE DAILY AS NEEDED FOR  PAIN   SUMAtriptan (IMITREX) 100 MG tablet TAKE 1 TABLET BY MOUTH  DAILY AS NEEDED   SUMAtriptan Succinate Refill 6 MG/0.5ML SOCT INJECT 6 MG INTO THE SKIN 1 TIME AS DIRECTED   zolpidem (AMBIEN) 10 MG tablet TAKE 1 TABLET BY MOUTH AT  BEDTIME AS NEEDED FOR SLEEP   cholecalciferol (VITAMIN D) 1000 UNITS tablet Take by mouth. Reported on 03/10/2015 (Patient not taking: Reported on 10/14/2021)   rosuvastatin (CRESTOR) 10 MG tablet TAKE 1 TABLET BY MOUTH  DAILY (Patient not taking: Reported on  10/14/2021)   sertraline (ZOLOFT) 100 MG tablet TAKE 1 TABLET BY MOUTH  DAILY (Patient not taking: Reported on 10/14/2021)   [DISCONTINUED] topiramate (TOPAMAX) 100 MG tablet TAKE 1 TABLET BY MOUTH  DAILY (Patient not taking: Reported on 10/14/2021)   No facility-administered medications prior to visit.    Review of Systems      Objective    There were no vitals taken for this visit.  Wt Readings from Last 3 Encounters:  09/24/19 142 lb (64.4 kg)  08/20/19 140 lb 3.2 oz (63.6 kg)  10/19/17 162 lb (73.5 kg)       Physical Exam     Assessment & Plan     1. COVID-19 At this time she does not need an antiviral.  No chest x-ray needed. If she worsens she will let us know otherwise we will use Robitussin twice a day and vitamin C vitamin D and zinc daily and Tylenol for her fever and body aches. She will follow-up with me in a few months.  No follow-ups on file.     I discussed the assessment and treatment plan with the patient. The patient was provided an opportunity to ask questions and all were answered. The patient agreed with the plan and demonstrated an understanding of the instructions.   The patient was advised to call back or seek an in-person evaluation if the symptoms worsen or if the condition fails to improve as anticipated.  I provided 11 minutes of non-face-to-face time  during this encounter.  I, Wilhemena Durie, MD, have reviewed all documentation for this visit. The documentation on 10/14/21 for the exam, diagnosis, procedures, and orders are all accurate and complete.   Jaquin Coy Cranford Mon, MD [facility] [phone] (phone) [phone] (fax)  [place]

## 2021-11-30 ENCOUNTER — Ambulatory Visit: Payer: Managed Care, Other (non HMO) | Admitting: Family Medicine

## 2021-11-30 ENCOUNTER — Ambulatory Visit: Payer: Managed Care, Other (non HMO) | Admitting: Physician Assistant

## 2021-11-30 ENCOUNTER — Encounter: Payer: Self-pay | Admitting: Family Medicine

## 2021-11-30 VITALS — BP 108/76 | HR 99 | Resp 16 | Wt 162.0 lb

## 2021-11-30 DIAGNOSIS — Z23 Encounter for immunization: Secondary | ICD-10-CM | POA: Diagnosis not present

## 2021-11-30 DIAGNOSIS — G4709 Other insomnia: Secondary | ICD-10-CM

## 2021-11-30 DIAGNOSIS — G43809 Other migraine, not intractable, without status migrainosus: Secondary | ICD-10-CM | POA: Diagnosis not present

## 2021-11-30 MED ORDER — SUMATRIPTAN SUCCINATE REFILL 6 MG/0.5ML ~~LOC~~ SOCT: SUBCUTANEOUS | 1 refills | Status: AC

## 2021-11-30 MED ORDER — ZOLPIDEM TARTRATE 10 MG PO TABS: 10.0000 mg | ORAL_TABLET | Freq: Every evening | ORAL | 0 refills | Status: AC | PRN

## 2021-11-30 MED ORDER — SUMATRIPTAN SUCCINATE 100 MG PO TABS: 100.0000 mg | ORAL_TABLET | Freq: Every day | ORAL | 3 refills | Status: AC | PRN

## 2021-11-30 NOTE — Progress Notes (Signed)
   SUBJECTIVE:   CHIEF COMPLAINT / HPI:   MIGRAINES - h/o frontal migraines, was getting few times per week, now only 2x per month.  - using head coolant wrap with good relief, will use imitrex if at work or if no relief with head wrap.  - denies side effects with imitrex.  - headaches usually frontal. Typically only gets nauseous if doesn't take imitrex in time.  - no changes in headache character recently.  Insomnia - ambien '5mg'$  prn (needs '10mg'$  written for insurance purposes). Using about once per week and if changing time zones. Needs refill, going to [place] soon.   OBJECTIVE:   BP 108/76 (BP Location: Right Arm, Patient Position: Sitting, Cuff Size: Normal)   Pulse 99   Resp 16   Wt 162 lb (73.5 kg)   SpO2 95%   BMI 28.70 kg/m   Gen: well appearing, in NAD Card: Reg rate Lungs: comfortable WOB on RA Ext: WWP, no edema   ASSESSMENT/PLAN:   Cannot sleep Doing well on current regimen, no changes made today. Refill for [facility] sent, take '5mg'$  prn.  Headache, migraine Doing well on current regimen, no changes made today. Refills sent.      Myles Gip, DO

## 2021-11-30 NOTE — Assessment & Plan Note (Signed)
Doing well on current regimen, no changes made today. Refill for [facility] sent, take '5mg'$  prn.

## 2021-11-30 NOTE — Assessment & Plan Note (Signed)
Doing well on current regimen, no changes made today. Refills sent.

## 2022-04-26 ENCOUNTER — Other Ambulatory Visit: Payer: Self-pay | Admitting: Family Medicine

## 2022-04-26 DIAGNOSIS — Z1231 Encounter for screening mammogram for malignant neoplasm of breast: Secondary | ICD-10-CM

## 2023-02-22 DIAGNOSIS — G43009 Migraine without aura, not intractable, without status migrainosus: Secondary | ICD-10-CM | POA: Diagnosis not present

## 2023-02-22 DIAGNOSIS — F5104 Psychophysiologic insomnia: Secondary | ICD-10-CM | POA: Diagnosis not present

## 2023-02-22 DIAGNOSIS — Z0000 Encounter for general adult medical examination without abnormal findings: Secondary | ICD-10-CM | POA: Diagnosis not present

## 2023-02-22 DIAGNOSIS — F411 Generalized anxiety disorder: Secondary | ICD-10-CM | POA: Diagnosis not present

## 2023-02-22 DIAGNOSIS — F332 Major depressive disorder, recurrent severe without psychotic features: Secondary | ICD-10-CM | POA: Diagnosis not present

## 2023-03-29 DIAGNOSIS — E785 Hyperlipidemia, unspecified: Secondary | ICD-10-CM | POA: Diagnosis not present

## 2023-03-29 DIAGNOSIS — G43009 Migraine without aura, not intractable, without status migrainosus: Secondary | ICD-10-CM | POA: Diagnosis not present

## 2023-03-29 DIAGNOSIS — Z0000 Encounter for general adult medical examination without abnormal findings: Secondary | ICD-10-CM | POA: Diagnosis not present

## 2023-03-29 DIAGNOSIS — F332 Major depressive disorder, recurrent severe without psychotic features: Secondary | ICD-10-CM | POA: Diagnosis not present

## 2023-03-29 DIAGNOSIS — F5104 Psychophysiologic insomnia: Secondary | ICD-10-CM | POA: Diagnosis not present

## 2023-03-29 DIAGNOSIS — F411 Generalized anxiety disorder: Secondary | ICD-10-CM | POA: Diagnosis not present

## 2023-07-28 DIAGNOSIS — G4709 Other insomnia: Secondary | ICD-10-CM | POA: Diagnosis not present

## 2023-07-28 DIAGNOSIS — F332 Major depressive disorder, recurrent severe without psychotic features: Secondary | ICD-10-CM | POA: Diagnosis not present

## 2023-07-28 DIAGNOSIS — F5101 Primary insomnia: Secondary | ICD-10-CM | POA: Diagnosis not present

## 2023-07-28 DIAGNOSIS — G44021 Chronic cluster headache, intractable: Secondary | ICD-10-CM | POA: Diagnosis not present

## 2023-07-28 DIAGNOSIS — E785 Hyperlipidemia, unspecified: Secondary | ICD-10-CM | POA: Diagnosis not present

## 2023-07-28 DIAGNOSIS — Z780 Asymptomatic menopausal state: Secondary | ICD-10-CM | POA: Diagnosis not present

## 2023-07-28 DIAGNOSIS — F411 Generalized anxiety disorder: Secondary | ICD-10-CM | POA: Diagnosis not present

## 2023-11-21 DIAGNOSIS — J40 Bronchitis, not specified as acute or chronic: Secondary | ICD-10-CM | POA: Diagnosis not present

## 2023-11-21 DIAGNOSIS — J329 Chronic sinusitis, unspecified: Secondary | ICD-10-CM | POA: Diagnosis not present

## 2023-12-29 DIAGNOSIS — R053 Chronic cough: Secondary | ICD-10-CM | POA: Diagnosis not present

## 2023-12-29 DIAGNOSIS — F411 Generalized anxiety disorder: Secondary | ICD-10-CM | POA: Diagnosis not present

## 2023-12-29 DIAGNOSIS — F332 Major depressive disorder, recurrent severe without psychotic features: Secondary | ICD-10-CM | POA: Diagnosis not present

## 2023-12-29 DIAGNOSIS — G4709 Other insomnia: Secondary | ICD-10-CM | POA: Diagnosis not present

## 2023-12-29 DIAGNOSIS — E785 Hyperlipidemia, unspecified: Secondary | ICD-10-CM | POA: Diagnosis not present
